# Patient Record
Sex: Female | Born: 1980 | Race: Black or African American | Hispanic: No | Marital: Single | Smoking: Never smoker
Health system: Southern US, Community
[De-identification: ages and names within clinical notes are randomized; demographics above are authoritative.]

## PROBLEM LIST (undated history)

## (undated) ENCOUNTER — Inpatient Hospital Stay (HOSPITAL_COMMUNITY): Payer: Self-pay

## (undated) DIAGNOSIS — G43909 Migraine, unspecified, not intractable, without status migrainosus: Secondary | ICD-10-CM

## (undated) DIAGNOSIS — L309 Dermatitis, unspecified: Secondary | ICD-10-CM

## (undated) DIAGNOSIS — Z9889 Other specified postprocedural states: Secondary | ICD-10-CM

## (undated) DIAGNOSIS — Z8742 Personal history of other diseases of the female genital tract: Secondary | ICD-10-CM

## (undated) DIAGNOSIS — N870 Mild cervical dysplasia: Secondary | ICD-10-CM

## (undated) DIAGNOSIS — K08409 Partial loss of teeth, unspecified cause, unspecified class: Secondary | ICD-10-CM

## (undated) DIAGNOSIS — R87629 Unspecified abnormal cytological findings in specimens from vagina: Secondary | ICD-10-CM

## (undated) HISTORY — DX: Mild cervical dysplasia: N87.0

## (undated) HISTORY — DX: Migraine, unspecified, not intractable, without status migrainosus: G43.909

## (undated) HISTORY — PX: COLPOSCOPY: SHX161

## (undated) HISTORY — PX: WISDOM TOOTH EXTRACTION: SHX21

---

## 2000-01-18 ENCOUNTER — Other Ambulatory Visit: Admission: RE | Admit: 2000-01-18 | Discharge: 2000-01-18 | Payer: Self-pay | Admitting: *Deleted

## 2001-04-01 ENCOUNTER — Other Ambulatory Visit: Admission: RE | Admit: 2001-04-01 | Discharge: 2001-04-01 | Payer: Self-pay | Admitting: *Deleted

## 2002-03-13 ENCOUNTER — Other Ambulatory Visit: Admission: RE | Admit: 2002-03-13 | Discharge: 2002-03-13 | Payer: Self-pay | Admitting: *Deleted

## 2006-01-24 ENCOUNTER — Ambulatory Visit: Payer: Self-pay | Admitting: Gastroenterology

## 2006-02-08 ENCOUNTER — Ambulatory Visit: Payer: Self-pay | Admitting: Gastroenterology

## 2006-04-05 ENCOUNTER — Ambulatory Visit: Payer: Self-pay | Admitting: Gastroenterology

## 2006-08-16 ENCOUNTER — Other Ambulatory Visit: Admission: RE | Admit: 2006-08-16 | Discharge: 2006-08-16 | Payer: Self-pay | Admitting: Obstetrics and Gynecology

## 2007-02-20 ENCOUNTER — Other Ambulatory Visit: Admission: RE | Admit: 2007-02-20 | Discharge: 2007-02-20 | Payer: Self-pay | Admitting: Obstetrics and Gynecology

## 2007-06-06 ENCOUNTER — Emergency Department (HOSPITAL_COMMUNITY): Admission: EM | Admit: 2007-06-06 | Discharge: 2007-06-06 | Payer: Self-pay | Admitting: Emergency Medicine

## 2008-08-12 ENCOUNTER — Ambulatory Visit: Payer: Self-pay | Admitting: Obstetrics and Gynecology

## 2008-08-12 ENCOUNTER — Other Ambulatory Visit: Admission: RE | Admit: 2008-08-12 | Discharge: 2008-08-12 | Payer: Self-pay | Admitting: Obstetrics and Gynecology

## 2008-08-12 ENCOUNTER — Encounter: Payer: Self-pay | Admitting: Obstetrics and Gynecology

## 2008-09-18 ENCOUNTER — Ambulatory Visit: Payer: Self-pay | Admitting: Obstetrics and Gynecology

## 2008-09-22 ENCOUNTER — Ambulatory Visit: Payer: Self-pay | Admitting: Obstetrics and Gynecology

## 2008-09-28 ENCOUNTER — Encounter: Payer: Self-pay | Admitting: Obstetrics and Gynecology

## 2008-09-28 ENCOUNTER — Ambulatory Visit: Payer: Self-pay | Admitting: Obstetrics and Gynecology

## 2008-09-28 ENCOUNTER — Ambulatory Visit (HOSPITAL_BASED_OUTPATIENT_CLINIC_OR_DEPARTMENT_OTHER): Admission: RE | Admit: 2008-09-28 | Discharge: 2008-09-28 | Payer: Self-pay | Admitting: Obstetrics and Gynecology

## 2008-09-28 DIAGNOSIS — Z9889 Other specified postprocedural states: Secondary | ICD-10-CM

## 2008-09-28 HISTORY — DX: Other specified postprocedural states: Z98.890

## 2008-09-28 HISTORY — PX: PELVIC LAPAROSCOPY: SHX162

## 2008-10-12 ENCOUNTER — Ambulatory Visit: Payer: Self-pay | Admitting: Obstetrics and Gynecology

## 2008-11-13 ENCOUNTER — Emergency Department (HOSPITAL_COMMUNITY): Admission: EM | Admit: 2008-11-13 | Discharge: 2008-11-13 | Payer: Self-pay | Admitting: Emergency Medicine

## 2009-02-27 ENCOUNTER — Emergency Department (HOSPITAL_COMMUNITY): Admission: EM | Admit: 2009-02-27 | Discharge: 2009-02-27 | Payer: Self-pay | Admitting: Emergency Medicine

## 2009-03-24 ENCOUNTER — Encounter: Payer: Self-pay | Admitting: Obstetrics and Gynecology

## 2009-03-24 ENCOUNTER — Ambulatory Visit: Payer: Self-pay | Admitting: Obstetrics and Gynecology

## 2009-03-24 ENCOUNTER — Other Ambulatory Visit: Admission: RE | Admit: 2009-03-24 | Discharge: 2009-03-24 | Payer: Self-pay | Admitting: Obstetrics and Gynecology

## 2009-05-21 ENCOUNTER — Emergency Department (HOSPITAL_COMMUNITY): Admission: EM | Admit: 2009-05-21 | Discharge: 2009-05-21 | Payer: Self-pay | Admitting: Emergency Medicine

## 2009-06-10 ENCOUNTER — Ambulatory Visit: Payer: Self-pay | Admitting: Gynecology

## 2009-06-15 ENCOUNTER — Ambulatory Visit: Payer: Self-pay | Admitting: Obstetrics and Gynecology

## 2009-08-17 ENCOUNTER — Other Ambulatory Visit: Admission: RE | Admit: 2009-08-17 | Discharge: 2009-08-17 | Payer: Self-pay | Admitting: Obstetrics and Gynecology

## 2009-08-17 ENCOUNTER — Ambulatory Visit: Payer: Self-pay | Admitting: Obstetrics and Gynecology

## 2009-11-22 ENCOUNTER — Emergency Department (HOSPITAL_COMMUNITY): Admission: EM | Admit: 2009-11-22 | Discharge: 2009-11-22 | Payer: Self-pay | Admitting: Family Medicine

## 2009-12-08 ENCOUNTER — Ambulatory Visit: Payer: Self-pay | Admitting: Obstetrics and Gynecology

## 2010-06-14 ENCOUNTER — Emergency Department (HOSPITAL_COMMUNITY): Admission: EM | Admit: 2010-06-14 | Discharge: 2010-06-14 | Payer: Self-pay | Admitting: Family Medicine

## 2010-06-15 ENCOUNTER — Other Ambulatory Visit: Admission: RE | Admit: 2010-06-15 | Discharge: 2010-06-15 | Payer: Self-pay | Admitting: Obstetrics and Gynecology

## 2010-06-15 ENCOUNTER — Ambulatory Visit: Payer: Self-pay | Admitting: Obstetrics and Gynecology

## 2010-12-05 LAB — POCT I-STAT, CHEM 8
BUN: 10 mg/dL (ref 6–23)
Creatinine, Ser: 0.6 mg/dL (ref 0.4–1.2)
Glucose, Bld: 95 mg/dL (ref 70–99)
Sodium: 138 mEq/L (ref 135–145)
TCO2: 27 mmol/L (ref 0–100)

## 2011-01-24 NOTE — Op Note (Signed)
Christie Camacho, Christie Camacho               ACCOUNT NO.:  [id number]   MEDICAL RECORD NO.:  [id number]          PATIENT TYPE:  AMB   LOCATION:  [facility]                         FACILITY:  [facility]   PHYSICIAN:  Daniel L. Gottsegen, M.D.DATE OF BIRTH:  May 03, 1981   DATE OF PROCEDURE:  09/28/2008  DATE OF DISCHARGE:                               OPERATIVE REPORT   PREOPERATIVE DIAGNOSES:  Pelvic pain, possible endometriosis.   POSTOPERATIVE DIAGNOSES:  Pelvic pain with endometriosis.   NAME OF OPERATION:  Diagnostic laparoscopy with lysis of pelvic  adhesions, excision of endometriosis.   SURGEON:  Daniel L. Eda Paschal, M.D.   ANESTHESIA:  General.   INDICATIONS:  The patient is a 30 year old nulligravida who has suffered  with intermittent lower vaginal pelvic pain for approximately 2-3 years.  The pain is intermittent, it can be severe or it can be completely gone.  She describes it as being near the top of her vagina rather than in her  lower abdomen.  It is not related to her periods.  She had a GI  assessment by Dr. Melvia Heaps that only revealed GERD.  She had a  urological assessment by Larey Dresser for microscopic hematuria  including a cystoscopy, and this did not show any reason for the pain.  She has been on oral contraceptives without much relief from the above.  She now enters the hospital for laparoscopy for both diagnosis and  treatment of the above.   FINDINGS:  At the time of laparoscopy, the patient had a mobile normal  uterus without disease.  Vesicouterine fold of peritoneum was free of  disease.  Left ovary and tube were completely normal, free of disease.  Underneath the left ovary there was no evidence of disease.  The patient  had luxuriant fimbria on the left fallopian tube.  On the right side,  however, the patient's right ovary was adherent to the peritoneum  posteriorly.  The fallopian tube itself was normal with luxuriant  fimbria.  Once the ovary was freed up  there were endometrial implants  where the ovary was attached as well as on the pelvic peritoneum in that  area.  The cul-de-sac otherwise was free of disease.   PROCEDURE:  After adequate general endotracheal anesthesia, the patient  was placed in the dorsal lithotomy position, prepped and draped in the  usual sterile manner.  A Hulka catheter was inserted into the patient's  uterus and a Robinson catheter emptied her bladder.  A subumbilical  incision was made using the Optiview and the diagnostic laparoscope.  The peritoneal cavity was entered atraumatically.  A pneumoperitoneum  was created.  A 5-mm port was placed suprapubically through which a  variety of instruments was placed.  The pelvis was assessed carefully  and was as noted above.  The neodymium YAG laser was then utilized using  the operating laparoscope.  It was set at 8 watts power, continuous.  A  G4 tip was used and the ovary was freed up carefully from the  peritoneum.  The areas of endometriosis in the ovary were laser  vaporized.  The areas  of endometriosis on the peritoneum where the ovary  was attached were laser excised and sent to pathology for tissue  diagnosis.  Copious irrigation was done with Ringer's lactate.  At the  termination of the procedure there was absolutely no bleeding noted and  no further evidence of endometriosis.  The trocars were removed.  The  pneumoperitoneum was evacuated.  The subumbilical fascial incision was  closed with a 0 Vicryl.  The skin incision at the umbilicus was closed  with Dermabond.  Because of some light bleeding, the 5-mm port incision suprapubically  was closed with 2 interrupted 3-0 Monocryl.  Estimated blood loss for  the entire procedure was less than 50 mL with none replaced.  The  patient tolerated the procedure well and left the operating room in  satisfactory condition.      Daniel L. Eda Paschal, M.D.  Electronically Signed     DLG/MEDQ  D:  09/28/2008  T:   09/28/2008  Job:  [id number]

## 2011-01-27 NOTE — Assessment & Plan Note (Signed)
[facility]                           GASTROENTEROLOGY OFFICE NOTE   NAME:HODGESJaonna, Christie Camacho                        MRN:          [id number]  DATE:04/05/2006                            DOB:          [birth year]    PROBLEM:  Nausea and vomiting.   REASON FOR VISIT:  [name] has returned for scheduled GI followup.  Upper  endoscopy was unremarkable.  She was subsequently seen by OB/GYN where she  apparently had cervical inflammation and was treated for an STD.  Since that  time, her GI complaints have entirely subsided.  She remains on an oral  contraceptive only.   PHYSICAL EXAMINATION:  VITAL SIGNS:  Pulse 74, blood pressure 112/80, weight  146.   IMPRESSION:  Nausea and vomiting, resolved.  It appears that this was  probably related to a subclinical gynecologic infection.   RECOMMENDATIONS:  Return to GI as needed.                                   Barbette Hair. Arlyce Dice, MD, [facility]   RDK/MedQ  DD:  04/05/2006  DT:  04/05/2006  Job #:  [id number]   cc:   Lelon Perla, DO

## 2011-01-29 ENCOUNTER — Inpatient Hospital Stay (INDEPENDENT_AMBULATORY_CARE_PROVIDER_SITE_OTHER)
Admission: RE | Admit: 2011-01-29 | Discharge: 2011-01-29 | Disposition: A | Discharge status: Home / Self Care | Payer: BC Managed Care – PPO | Source: Ambulatory Visit | Attending: Family Medicine | Admitting: Family Medicine

## 2011-01-29 DIAGNOSIS — L03019 Cellulitis of unspecified finger: Secondary | ICD-10-CM

## 2011-02-13 ENCOUNTER — Encounter: Payer: Self-pay | Admitting: Obstetrics and Gynecology

## 2011-02-27 ENCOUNTER — Encounter (INDEPENDENT_AMBULATORY_CARE_PROVIDER_SITE_OTHER): Payer: BC Managed Care – PPO | Admitting: Obstetrics and Gynecology

## 2011-02-27 ENCOUNTER — Other Ambulatory Visit: Payer: Self-pay | Admitting: Obstetrics and Gynecology

## 2011-02-27 DIAGNOSIS — R87619 Unspecified abnormal cytological findings in specimens from cervix uteri: Secondary | ICD-10-CM | POA: Insufficient documentation

## 2011-02-27 DIAGNOSIS — R823 Hemoglobinuria: Secondary | ICD-10-CM

## 2011-02-27 DIAGNOSIS — Z01419 Encounter for gynecological examination (general) (routine) without abnormal findings: Secondary | ICD-10-CM

## 2011-03-06 ENCOUNTER — Other Ambulatory Visit: Payer: BC Managed Care – PPO

## 2011-03-06 ENCOUNTER — Ambulatory Visit: Payer: BC Managed Care – PPO | Admitting: Obstetrics and Gynecology

## 2011-03-17 ENCOUNTER — Other Ambulatory Visit: Payer: BC Managed Care – PPO

## 2011-03-17 ENCOUNTER — Ambulatory Visit (INDEPENDENT_AMBULATORY_CARE_PROVIDER_SITE_OTHER): Payer: BC Managed Care – PPO | Admitting: Obstetrics and Gynecology

## 2011-03-17 DIAGNOSIS — D259 Leiomyoma of uterus, unspecified: Secondary | ICD-10-CM

## 2011-03-17 DIAGNOSIS — N949 Unspecified condition associated with female genital organs and menstrual cycle: Secondary | ICD-10-CM

## 2011-03-17 DIAGNOSIS — N809 Endometriosis, unspecified: Secondary | ICD-10-CM

## 2011-03-30 ENCOUNTER — Other Ambulatory Visit (HOSPITAL_COMMUNITY)
Admission: RE | Admit: 2011-03-30 | Discharge: 2011-03-30 | Disposition: A | Discharge status: Home / Self Care | Payer: BC Managed Care – PPO | Source: Ambulatory Visit | Attending: Obstetrics and Gynecology | Admitting: Obstetrics and Gynecology

## 2011-05-02 ENCOUNTER — Inpatient Hospital Stay (INDEPENDENT_AMBULATORY_CARE_PROVIDER_SITE_OTHER)
Admission: RE | Admit: 2011-05-02 | Discharge: 2011-05-02 | Disposition: A | Discharge status: Home / Self Care | Payer: BC Managed Care – PPO | Source: Ambulatory Visit | Attending: Family Medicine | Admitting: Family Medicine

## 2011-05-02 DIAGNOSIS — R071 Chest pain on breathing: Secondary | ICD-10-CM

## 2011-09-28 ENCOUNTER — Ambulatory Visit (INDEPENDENT_AMBULATORY_CARE_PROVIDER_SITE_OTHER): Payer: BC Managed Care – PPO | Admitting: Obstetrics and Gynecology

## 2011-09-28 DIAGNOSIS — N898 Other specified noninflammatory disorders of vagina: Secondary | ICD-10-CM

## 2011-09-28 DIAGNOSIS — N760 Acute vaginitis: Secondary | ICD-10-CM

## 2011-09-28 DIAGNOSIS — N899 Noninflammatory disorder of vagina, unspecified: Secondary | ICD-10-CM

## 2011-09-28 DIAGNOSIS — A499 Bacterial infection, unspecified: Secondary | ICD-10-CM

## 2011-09-28 LAB — WET PREP FOR TRICH, YEAST, CLUE: Trich, Wet Prep: NONE SEEN

## 2011-09-28 MED ORDER — METRONIDAZOLE 0.75 % VA GEL: 1.0000 | Freq: Two times a day (BID) | VAGINAL | Status: AC

## 2011-09-28 NOTE — Progress Notes (Signed)
Patient came to see me today because of irritation on the opening of her vagina. She looked with the mirror and sore some small lesions. She is unaware of vaginal discharge. She is unaware odor  or itching. She has not been on antibiotics.  External exam: She is very irritated on the right near the introitus and it looks as if she's scratched way the skin. There were also some very small areas of folliculitis. There is no evidence of herpetic lesion or any other lesion. Vaginal exam: Very heavy discharge. Wet prep positive for clue cells white blood cells and bacteria.  Assessment: Bacterial vaginosis  Plan: MetroGel vaginal cream one applicatorful at bedtime in the vagina for 5 nights.

## 2011-10-24 ENCOUNTER — Emergency Department (HOSPITAL_COMMUNITY)
Admission: EM | Admit: 2011-10-24 | Discharge: 2011-10-25 | Disposition: A | Discharge status: Home / Self Care | Payer: BC Managed Care – PPO | Attending: Emergency Medicine | Admitting: Emergency Medicine

## 2011-10-24 ENCOUNTER — Encounter (HOSPITAL_COMMUNITY): Payer: Self-pay | Admitting: Emergency Medicine

## 2011-10-24 ENCOUNTER — Encounter (HOSPITAL_COMMUNITY): Payer: Self-pay | Admitting: *Deleted

## 2011-10-24 ENCOUNTER — Emergency Department (INDEPENDENT_AMBULATORY_CARE_PROVIDER_SITE_OTHER)
Admission: EM | Admit: 2011-10-24 | Discharge: 2011-10-24 | Disposition: A | Discharge status: Other Acute Inpatient Hospital | Payer: BC Managed Care – PPO | Source: Home / Self Care | Attending: Emergency Medicine | Admitting: Emergency Medicine

## 2011-10-24 DIAGNOSIS — R112 Nausea with vomiting, unspecified: Secondary | ICD-10-CM | POA: Insufficient documentation

## 2011-10-24 DIAGNOSIS — R109 Unspecified abdominal pain: Secondary | ICD-10-CM | POA: Insufficient documentation

## 2011-10-24 DIAGNOSIS — R319 Hematuria, unspecified: Secondary | ICD-10-CM

## 2011-10-24 DIAGNOSIS — R1031 Right lower quadrant pain: Secondary | ICD-10-CM

## 2011-10-24 LAB — POCT URINALYSIS DIP (DEVICE)
Bilirubin Urine: NEGATIVE
Glucose, UA: NEGATIVE mg/dL
Leukocytes, UA: NEGATIVE
Nitrite: NEGATIVE

## 2011-10-24 LAB — POCT PREGNANCY, URINE: Preg Test, Ur: NEGATIVE

## 2011-10-24 NOTE — ED Provider Notes (Signed)
History     CSN: [id number]  Arrival date & time 10/24/11  1819   First MD Initiated Contact with Patient 10/24/11 2028      Chief Complaint  Patient presents with  . Abdominal Pain    (Consider location/radiation/quality/duration/timing/severity/associated sxs/prior treatment) HPI Comments: Patient with 5 days of constant, but waxing and waning, sharp abdominal pain that started around the umbilicus 5 days ago. Patient states it is now migrated to the right lower quadrant over the past 2 days. Reports vomiting starting today. No fevers, abdominal distention, back pain, urinary urgency, frequency, oderous urine, cloudy urine, vaginal bleeding, vaginal discharge. Pain not affected with eating, fasting, movement, urination. Last bowel movement was today, and was WNL for patient. Patient reports mild anorexia. Has a history of endometriosis, but states that this feels "different" than previous endometriosis pain. Hasn't taken anything for her symptoms. Past medical history significant for endometriosis. No history of PID, ovarian disorders, diabetes, nephrolithiasis.  ROS as noted in HPI. All other ROS negative.   Patient is a 31 y.o. female presenting with abdominal pain. The history is provided by the patient. No language interpreter was used.  Abdominal Pain The primary symptoms of the illness include abdominal pain. The current episode started more than 2 days ago. The problem has been gradually worsening.  The patient states that she believes she is currently not pregnant. The patient has not had a change in bowel habit. Additional symptoms associated with the illness include anorexia. Symptoms associated with the illness do not include constipation, urgency, hematuria, frequency or back pain.    Past Medical History  Diagnosis Date  . CIN I (cervical intraepithelial neoplasia I)   . Endometriosis   . Migraines     Past Surgical History  Procedure Date  . Pelvic laparoscopy  09/28/08    DIAGNOSTIC LAPAROSCOPY    Family History  Problem Relation Age of Onset  . Hypertension Mother   . Diabetes Father   . Hypertension Father     History  Substance Use Topics  . Smoking status: Never Smoker   . Smokeless tobacco: Not on file  . Alcohol Use: Yes    OB History    Grav Para Term Preterm Abortions TAB SAB Ect Mult Living                  Review of Systems  Gastrointestinal: Positive for abdominal pain and anorexia. Negative for constipation.  Genitourinary: Negative for urgency, frequency and hematuria.  Musculoskeletal: Negative for back pain.    Allergies  Review of patient's allergies indicates no known allergies.  Home Medications   Current Outpatient Rx  Name Route Sig Dispense Refill  . MULTIVITAMIN PO Oral Take by mouth.        BP 137/87  Pulse 88  Temp(Src) 98.4 F (36.9 C) (Oral)  Resp 16  SpO2 100%  LMP 10/10/2011  Physical Exam  Nursing note and vitals reviewed. Constitutional: She is oriented to person, place, and time. She appears well-developed and well-nourished.  HENT:  Head: Normocephalic and atraumatic.  Eyes: Conjunctivae and EOM are normal.  Neck: Normal range of motion.  Cardiovascular: Normal rate, regular rhythm, normal heart sounds and intact distal pulses.   No murmur heard. Pulmonary/Chest: Effort normal and breath sounds normal.  Abdominal: Soft. Bowel sounds are normal. She exhibits no distension. There is no hepatomegaly. There is tenderness in the right lower quadrant. There is tenderness at McBurney's point. There is no rebound, no guarding, no CVA  tenderness and negative Murphy's sign.       Healed laparoscopic scars  Musculoskeletal: Normal range of motion. She exhibits no edema and no tenderness.  Neurological: She is alert and oriented to person, place, and time.  Skin: Skin is warm and dry.  Psychiatric: She has a normal mood and affect. Her behavior is normal. Judgment and thought content normal.     ED Course  Procedures (including critical care time)  Labs Reviewed  POCT URINALYSIS DIP (DEVICE) - Abnormal; Notable for the following:    Ketones, ur 15 (*)    Hgb urine dipstick LARGE (*)    All other components within normal limits  POCT PREGNANCY, URINE   No results found.   No diagnosis found.  Results for orders placed during the hospital encounter of 10/24/11  POCT URINALYSIS DIP (DEVICE)      Component Value Range   Glucose, UA NEGATIVE  NEGATIVE (mg/dL)   Bilirubin Urine NEGATIVE  NEGATIVE    Ketones, ur 15 (*) NEGATIVE (mg/dL)   Specific Gravity, Urine >=1.030  1.005 - 1.030    Hgb urine dipstick LARGE (*) NEGATIVE    pH 5.5  5.0 - 8.0    Protein, ur NEGATIVE  NEGATIVE (mg/dL)   Urobilinogen, UA 0.2  0.0 - 1.0 (mg/dL)   Nitrite NEGATIVE  NEGATIVE    Leukocytes, UA NEGATIVE  NEGATIVE   POCT PREGNANCY, URINE      Component Value Range   Preg Test, Ur NEGATIVE  NEGATIVE     MDM  Patient with history and exam concerning for appendicitis. Udip noted. Patient denies any vaginal bleeding. Kidney stone also in differential. Transferring to the ER.  Luiz Blare, MD 10/24/11 2105

## 2011-10-24 NOTE — ED Notes (Signed)
C/o abdominal pain, more so on the right lower abdomen than the left.  No back pain.  Last bowel movement was today and normal per patient.  Patient denies urinary symptoms.  Denies vaginal discharge.  No fever, but has had nausea, and one episode of vomiting

## 2011-10-24 NOTE — ED Notes (Signed)
The pt was transferred down from [facility] with abd pain.  She has had abd pain for 4 days.  lmp jan 29th

## 2011-10-25 ENCOUNTER — Emergency Department (HOSPITAL_COMMUNITY): Payer: BC Managed Care – PPO

## 2011-10-25 ENCOUNTER — Encounter (HOSPITAL_COMMUNITY): Payer: Self-pay | Admitting: Radiology

## 2011-10-25 LAB — CBC
HCT: 43.1 % (ref 36.0–46.0)
MCHC: 32.9 g/dL (ref 30.0–36.0)
MCV: 81.6 fL (ref 78.0–100.0)
RDW: 13.5 % (ref 11.5–15.5)

## 2011-10-25 LAB — BASIC METABOLIC PANEL
BUN: 9 mg/dL (ref 6–23)
CO2: 27 mEq/L (ref 19–32)
Chloride: 103 mEq/L (ref 96–112)
Creatinine, Ser: 0.74 mg/dL (ref 0.50–1.10)

## 2011-10-25 LAB — URINALYSIS, ROUTINE W REFLEX MICROSCOPIC
Leukocytes, UA: NEGATIVE
Protein, ur: NEGATIVE mg/dL
Urobilinogen, UA: 0.2 mg/dL (ref 0.0–1.0)

## 2011-10-25 MED ORDER — HYDROMORPHONE HCL PF 1 MG/ML IJ SOLN
1.0000 mg | Freq: Once | INTRAMUSCULAR | Status: AC
  Administered 2011-10-25: 1 mg via INTRAVENOUS
  Filled 2011-10-25: qty 1

## 2011-10-25 MED ORDER — ONDANSETRON HCL 4 MG/2ML IJ SOLN
4.0000 mg | Freq: Once | INTRAMUSCULAR | Status: AC
  Administered 2011-10-25: 4 mg via INTRAVENOUS
  Filled 2011-10-25: qty 2

## 2011-10-25 MED ORDER — KETOROLAC TROMETHAMINE 30 MG/ML IJ SOLN
30.0000 mg | Freq: Once | INTRAMUSCULAR | Status: AC
  Administered 2011-10-25: 30 mg via INTRAVENOUS
  Filled 2011-10-25: qty 1

## 2011-10-25 MED ORDER — SODIUM CHLORIDE 0.9 % IV BOLUS (SEPSIS)
1000.0000 mL | Freq: Once | INTRAVENOUS | Status: AC
  Administered 2011-10-25: 1000 mL via INTRAVENOUS

## 2011-10-25 NOTE — ED Notes (Signed)
Pt states pain 2/10.  Resting comfortably.

## 2011-10-25 NOTE — Discharge Instructions (Signed)
Your evaluation in the emergency department tonight did not result in a specific diagnosis of your abdominal pain.  It is very important that you continue to have your condition evaluated by her primary care physician.  Please make sure to call today to schedule an appropriate followup visit.  Please return to the emergency department for any concerning changes in your condition.   Abdominal Pain Abdominal pain can be caused by many things. Your caregiver decides the seriousness of your pain by an examination and possibly blood tests and X-rays. Many cases can be observed and treated at home. Most abdominal pain is not caused by a disease and will probably improve without treatment. However, in many cases, more time must pass before a clear cause of the pain can be found. Before that point, it may not be known if you need more testing, or if hospitalization or surgery is needed. HOME CARE INSTRUCTIONS   Do not take laxatives unless directed by your caregiver.   Take pain medicine only as directed by your caregiver.   Only take over-the-counter or prescription medicines for pain, discomfort, or fever as directed by your caregiver.   Try a clear liquid diet (broth, tea, or water) for as long as directed by your caregiver. Slowly move to a bland diet as tolerated.  SEEK IMMEDIATE MEDICAL CARE IF:   The pain does not go away.   You have a fever.   You keep throwing up (vomiting).   The pain is felt only in portions of the abdomen. Pain in the right side could possibly be appendicitis. In an adult, pain in the left lower portion of the abdomen could be colitis or diverticulitis.   You pass bloody or black tarry stools.  MAKE SURE YOU:   Understand these instructions.   Will watch your condition.   Will get help right away if you are not doing well or get worse.  Document Released: 06/07/2005 Document Revised: 05/10/2011 Document Reviewed: 04/15/2008 [facility] Patient Information 2012  [place], [place].

## 2011-10-25 NOTE — ED Provider Notes (Signed)
History     CSN: [id number]  Arrival date & time 10/24/11  2304   None     Chief Complaint  Patient presents with  . Abdominal Pain    HPI  the patient presents with 5 days of abdominal pain.  She notes a waxing and waning.  The pain is focally about the right lower quadrant, though it initiated in her epigastriumn insidious onset.  Since onset there is been at least some degree of pain constantly, though the pain started near her umbilicus.  The pain is sharp. She notes multiple episodes of emesis, mild nausea.  No diarrhea.  No fevers, no chills, no dyspnea, no chest pain, no other focal complaints.  Patient initially presented to urgent care, was transferred to this facility for further evaluation. Last menstrual period 2 weeks ago, no vaginal discharge, no bleeding, no pain  Past Medical History  Diagnosis Date  . CIN I (cervical intraepithelial neoplasia I)   . Endometriosis   . Migraines     Past Surgical History  Procedure Date  . Pelvic laparoscopy 09/28/08    DIAGNOSTIC LAPAROSCOPY    Family History  Problem Relation Age of Onset  . Hypertension Mother   . Diabetes Father   . Hypertension Father     History  Substance Use Topics  . Smoking status: Never Smoker   . Smokeless tobacco: Not on file  . Alcohol Use: Yes    OB History    Grav Para Term Preterm Abortions TAB SAB Ect Mult Living                  Review of Systems  Constitutional:       HPI  HENT:       HPI otherwise negative  Eyes: Negative.   Respiratory:       HPI, otherwise negative  Cardiovascular:       HPI, otherwise nmegative  Gastrointestinal: Positive for vomiting.  Genitourinary:       HPI, otherwise negative  Musculoskeletal:       HPI, otherwise negative  Skin: Negative.   Neurological: Negative for syncope.    Allergies  Review of patient's allergies indicates no known allergies.  Home Medications   Current Outpatient Rx  Name Route Sig Dispense Refill  . THERA  M PLUS PO TABS Oral Take 1 tablet by mouth daily.      BP 121/82  Pulse 71  Temp(Src) 98.3 F (36.8 C) (Oral)  Resp 20  SpO2 100%  LMP 10/10/2011  Physical Exam  Nursing note and vitals reviewed. Constitutional: She is oriented to person, place, and time. She appears well-developed and well-nourished. No distress.  HENT:  Head: Normocephalic and atraumatic.  Eyes: Conjunctivae and EOM are normal.  Cardiovascular: Normal rate and regular rhythm.   Pulmonary/Chest: Effort normal and breath sounds normal. No stridor. No respiratory distress.  Abdominal: Soft. Bowel sounds are normal. She exhibits no distension. There is no tenderness. There is no rebound and no guarding.  Musculoskeletal: She exhibits no edema.  Neurological: She is alert and oriented to person, place, and time. No cranial nerve deficit.  Skin: Skin is warm and dry.  Psychiatric: She has a normal mood and affect.    ED Course  Procedures (including critical care time)  Labs Reviewed  URINALYSIS, ROUTINE W REFLEX MICROSCOPIC - Abnormal; Notable for the following:    Hgb urine dipstick MODERATE (*)    All other components within normal limits  URINE MICROSCOPIC-ADD ON -  Abnormal; Notable for the following:    Squamous Epithelial / LPF FEW (*)    All other components within normal limits  POCT PREGNANCY, URINE  BASIC METABOLIC PANEL  CBC   No results found.   No diagnosis found.    MDM  This generally well female presents with several days of abdominal pain.  On exam she is in no distress.  The patient's history of right lower quadrant pain, absent any vaginal complaints is concerning for acute appendicitis.  The patient's CT does not demonstrate acute pathology.  The remainder of her evaluation is essentially reassuring.  Given his absence of acute findings, the patient's generally benign status, she'll be discharged in stable condition to follow up with her primary care physician        Gerhard Munch, MD 10/25/11 0500

## 2011-10-25 NOTE — ED Notes (Signed)
Pt states pain continues to be 8/10.  Pt calm.  VS stable.  Pt told of plan of care and IV placed.

## 2011-11-30 ENCOUNTER — Other Ambulatory Visit (HOSPITAL_COMMUNITY): Payer: Self-pay | Admitting: Urology

## 2011-11-30 DIAGNOSIS — K819 Cholecystitis, unspecified: Secondary | ICD-10-CM

## 2011-12-07 ENCOUNTER — Ambulatory Visit (HOSPITAL_COMMUNITY)
Admission: RE | Admit: 2011-12-07 | Discharge: 2011-12-07 | Disposition: A | Discharge status: Home / Self Care | Payer: BC Managed Care – PPO | Source: Ambulatory Visit | Attending: Urology | Admitting: Urology

## 2011-12-07 DIAGNOSIS — R109 Unspecified abdominal pain: Secondary | ICD-10-CM | POA: Insufficient documentation

## 2011-12-07 DIAGNOSIS — R112 Nausea with vomiting, unspecified: Secondary | ICD-10-CM | POA: Insufficient documentation

## 2011-12-07 DIAGNOSIS — K819 Cholecystitis, unspecified: Secondary | ICD-10-CM

## 2012-02-01 ENCOUNTER — Encounter (HOSPITAL_COMMUNITY): Payer: Self-pay | Admitting: *Deleted

## 2012-02-01 ENCOUNTER — Emergency Department (INDEPENDENT_AMBULATORY_CARE_PROVIDER_SITE_OTHER)
Admission: EM | Admit: 2012-02-01 | Discharge: 2012-02-01 | Disposition: A | Discharge status: Home / Self Care | Payer: BC Managed Care – PPO | Source: Home / Self Care | Attending: Family Medicine | Admitting: Family Medicine

## 2012-02-01 DIAGNOSIS — L259 Unspecified contact dermatitis, unspecified cause: Secondary | ICD-10-CM

## 2012-02-01 HISTORY — DX: Dermatitis, unspecified: L30.9

## 2012-02-01 MED ORDER — FLUTICASONE PROPIONATE 0.05 % EX CREA: TOPICAL_CREAM | Freq: Two times a day (BID) | CUTANEOUS | Status: AC

## 2012-02-01 NOTE — ED Notes (Signed)
Pt  Reports  Symptoms  Of  Rash  Between  Thighs   X    2  Days   She  Reports  She  Has   Eczyma    In past    And  Has  Seen a  Dermatologist   In past

## 2012-02-01 NOTE — ED Provider Notes (Signed)
History     CSN: [id number]  Arrival date & time 02/01/12  1352   First MD Initiated Contact with Patient 02/01/12 1403      Chief Complaint  Patient presents with  . Rash    (Consider location/radiation/quality/duration/timing/severity/associated sxs/prior treatment) Patient is a 31 y.o. female presenting with rash. The history is provided by the patient.  Rash  This is a new problem. The current episode started 2 days ago. The problem has not changed since onset.The problem is associated with an unknown factor. The rash is present on the right upper leg and left upper leg. The pain is mild. Associated symptoms include itching.    Past Medical History  Diagnosis Date  . CIN I (cervical intraepithelial neoplasia I)   . Endometriosis   . Migraines   . Eczema     Past Surgical History  Procedure Date  . Pelvic laparoscopy 09/28/08    DIAGNOSTIC LAPAROSCOPY    Family History  Problem Relation Age of Onset  . Hypertension Mother   . Diabetes Father   . Hypertension Father     History  Substance Use Topics  . Smoking status: Never Smoker   . Smokeless tobacco: Not on file  . Alcohol Use: Yes    OB History    Grav Para Term Preterm Abortions TAB SAB Ect Mult Living                  Review of Systems  Constitutional: Negative.   Skin: Positive for itching and rash.    Allergies  Review of patient's allergies indicates no known allergies.  Home Medications   Current Outpatient Rx  Name Route Sig Dispense Refill  . FLUTICASONE PROPIONATE 0.05 % EX CREA Topical Apply topically 2 (two) times daily. 30 g 0  . THERA M PLUS PO TABS Oral Take 1 tablet by mouth daily.      BP 131/78  Pulse 81  Temp(Src) 98.6 F (37 C) (Oral)  Resp 16  SpO2 100%  LMP 01/03/2012  Physical Exam  Nursing note and vitals reviewed. Constitutional: She is oriented to person, place, and time. She appears well-developed and well-nourished.  Neurological: She is alert and  oriented to person, place, and time.  Skin: Skin is warm and dry. Rash noted.       Fine papular rash on prox inner thighs left > right.    ED Course  Procedures (including critical care time)  Labs Reviewed - No data to display No results found.   1. Contact dermatitis and eczema       MDM          Linna Hoff, MD 02/01/12 1432

## 2012-03-06 ENCOUNTER — Emergency Department (INDEPENDENT_AMBULATORY_CARE_PROVIDER_SITE_OTHER): Payer: BC Managed Care – PPO

## 2012-03-06 ENCOUNTER — Emergency Department (HOSPITAL_COMMUNITY)
Admission: EM | Admit: 2012-03-06 | Discharge: 2012-03-06 | Disposition: A | Discharge status: Home / Self Care | Payer: BC Managed Care – PPO | Source: Home / Self Care | Attending: Emergency Medicine | Admitting: Emergency Medicine

## 2012-03-06 ENCOUNTER — Encounter (HOSPITAL_COMMUNITY): Payer: Self-pay | Admitting: Emergency Medicine

## 2012-03-06 DIAGNOSIS — S91309A Unspecified open wound, unspecified foot, initial encounter: Secondary | ICD-10-CM

## 2012-03-06 DIAGNOSIS — S99929A Unspecified injury of unspecified foot, initial encounter: Secondary | ICD-10-CM

## 2012-03-06 MED ORDER — DICLOFENAC SODIUM 1 % TD GEL: 1.0000 "application " | Freq: Four times a day (QID) | TRANSDERMAL | Status: DC

## 2012-03-06 NOTE — ED Provider Notes (Signed)
History     CSN: [id number]  Arrival date & time 03/06/12  1531   First MD Initiated Contact with Patient 03/06/12 1610      Chief Complaint  Patient presents with  . Foot Pain    (Consider location/radiation/quality/duration/timing/severity/associated sxs/prior treatment) Patient is a 31 y.o. female presenting with lower extremity pain. The history is provided by the patient.  Foot Pain This is a new problem. The current episode started yesterday. The problem occurs constantly. The problem has not changed since onset.The symptoms are aggravated by walking.   The patient complains of pain and swelling on the base of her first toe on the right which started yesterday. She does not remember injuring it but was at the beach last week and did wear flip flops constants. She has tried icing it. No pain meds taken. No h/o Gout.    Past Medical History  Diagnosis Date  . CIN I (cervical intraepithelial neoplasia I)   . Endometriosis   . Migraines   . Eczema     Past Surgical History  Procedure Date  . Pelvic laparoscopy 09/28/08    DIAGNOSTIC LAPAROSCOPY    Family History  Problem Relation Age of Onset  . Hypertension Mother   . Diabetes Father   . Hypertension Father     History  Substance Use Topics  . Smoking status: Never Smoker   . Smokeless tobacco: Not on file  . Alcohol Use: Yes    OB History    Grav Para Term Preterm Abortions TAB SAB Ect Mult Living                  Review of Systems  Constitutional: Negative.   Respiratory: Negative.   Cardiovascular: Negative.   Gastrointestinal: Negative.   Genitourinary: Negative.   Musculoskeletal: Positive for joint swelling and arthralgias. Negative for back pain and gait problem.  Neurological: Negative.     Allergies  Review of patient's allergies indicates no known allergies.  Home Medications   Current Outpatient Rx  Name Route Sig Dispense Refill  . DICLOFENAC SODIUM 1 % TD GEL Topical Apply 1  application topically 4 (four) times daily. 100 g 0  . FLUTICASONE PROPIONATE 0.05 % EX CREA Topical Apply topically 2 (two) times daily. 30 g 0  . THERA M PLUS PO TABS Oral Take 1 tablet by mouth daily.      BP 130/84  Pulse 76  Temp 99.1 F (37.3 C) (Oral)  Resp 18  SpO2 100%  LMP 03/04/2012  Physical Exam  Constitutional: She is oriented to person, place, and time. She appears well-developed and well-nourished.  HENT:  Head: Normocephalic and atraumatic.  Eyes: Pupils are equal, round, and reactive to light.  Cardiovascular: Normal rate, regular rhythm and normal heart sounds.   Pulmonary/Chest: Breath sounds normal.  Abdominal: Soft.  Musculoskeletal:       Swelling and tenderness on medial aspect of head of first metatarsal. No warmth or discharge. Non-fluctuant.  Appears very similar to a bunion.   Neurological: She is alert and oriented to person, place, and time.  Skin: Skin is warm and dry. No erythema.    ED Course  Procedures (including critical care time)  Labs Reviewed - No data to display Dg Foot Complete Right  03/06/2012  *RADIOLOGY REPORT*  Clinical Data: First metatarsal and great toe pain for 1 day.  RIGHT FOOT COMPLETE - 3+ VIEW  Comparison: None.  Findings: Minimal soft tissue prominence medial to the head of  the first metatarsal noted.  No fracture is observed.  Bifid medial sesamoid of the first digit is incidentally observed.  No malalignment at the Lisfranc joint.  No acute bony findings.  IMPRESSION:  1.  Slight soft tissue prominence medial to the head of the first metatarsal, without significant underlying bony abnormality.  Original Report Authenticated By: Dellia Cloud, M.D.     1. Soft tissue injury of foot       MDM  Ice and then heat- post op shoe, Voltaren gel        Calvert Cantor, MD 03/06/12 Ernestina [place]

## 2012-03-06 NOTE — ED Notes (Signed)
Right foot pain.  Unknown injury.  Pain in joint associated with the base of great toe.  Reports swollen and painful

## 2012-03-06 NOTE — ED Notes (Signed)
Reviewed instructions and script for voltaren gel. Patient questioned a "boot". Asked physician

## 2012-06-23 ENCOUNTER — Emergency Department (HOSPITAL_COMMUNITY)
Admission: EM | Admit: 2012-06-23 | Discharge: 2012-06-23 | Disposition: A | Discharge status: Home / Self Care | Payer: BC Managed Care – PPO | Source: Home / Self Care | Attending: Family Medicine | Admitting: Family Medicine

## 2012-06-23 ENCOUNTER — Encounter (HOSPITAL_COMMUNITY): Payer: Self-pay | Admitting: *Deleted

## 2012-06-23 DIAGNOSIS — J069 Acute upper respiratory infection, unspecified: Secondary | ICD-10-CM

## 2012-06-23 MED ORDER — AZITHROMYCIN 250 MG PO TABS: ORAL_TABLET | ORAL | Status: DC

## 2012-06-23 NOTE — ED Notes (Signed)
Pt reports cough , lost of voice since Wednesday with no relief from otc products

## 2012-06-23 NOTE — ED Provider Notes (Signed)
History     CSN: [id number]  Arrival date & time 06/23/12  1325   First MD Initiated Contact with Patient 06/23/12 1329      Chief Complaint  Patient presents with  . URI    (Consider location/radiation/quality/duration/timing/severity/associated sxs/prior treatment) Patient is a 31 y.o. female presenting with URI. The history is provided by the patient.  URI The primary symptoms include sore throat and cough. Primary symptoms do not include fever, swollen glands, nausea, vomiting or myalgias. Primary symptoms comment: laryngitis  The current episode started 3 to 5 days ago. This is a new problem. The problem has not changed since onset. The illness is not associated with congestion or rhinorrhea.    Past Medical History  Diagnosis Date  . CIN I (cervical intraepithelial neoplasia I)   . Endometriosis   . Migraines   . Eczema     Past Surgical History  Procedure Date  . Pelvic laparoscopy 09/28/08    DIAGNOSTIC LAPAROSCOPY    Family History  Problem Relation Age of Onset  . Hypertension Mother   . Diabetes Father   . Hypertension Father     History  Substance Use Topics  . Smoking status: Never Smoker   . Smokeless tobacco: Not on file  . Alcohol Use: Yes    OB History    Grav Para Term Preterm Abortions TAB SAB Ect Mult Living                  Review of Systems  Constitutional: Negative.  Negative for fever.  HENT: Positive for sore throat and voice change. Negative for congestion, rhinorrhea and postnasal drip.   Respiratory: Positive for cough.   Cardiovascular: Negative.   Gastrointestinal: Negative.  Negative for nausea and vomiting.  Musculoskeletal: Negative for myalgias.    Allergies  Review of patient's allergies indicates no known allergies.  Home Medications   Current Outpatient Rx  Name Route Sig Dispense Refill  . AZITHROMYCIN 250 MG PO TABS  Take as directed on pack 6 each 0  . DICLOFENAC SODIUM 1 % TD GEL Topical Apply 1  application topically 4 (four) times daily. 100 g 0  . FLUTICASONE PROPIONATE 0.05 % EX CREA Topical Apply topically 2 (two) times daily. 30 g 0  . THERA M PLUS PO TABS Oral Take 1 tablet by mouth daily.      BP 146/89  Pulse 89  Temp 98.3 F (36.8 C) (Oral)  Resp 20  SpO2 100%  Physical Exam  Nursing note and vitals reviewed. Constitutional: She is oriented to person, place, and time. She appears well-developed and well-nourished.  HENT:  Head: Normocephalic.  Right Ear: External ear normal.  Left Ear: External ear normal.  Nose: Nose normal.  Mouth/Throat: Oropharynx is clear and moist.  Eyes: Pupils are equal, round, and reactive to light.  Neck: Normal range of motion. Neck supple.  Cardiovascular: Normal rate, regular rhythm, normal heart sounds and intact distal pulses.   Pulmonary/Chest: Effort normal and breath sounds normal.  Lymphadenopathy:    She has no cervical adenopathy.  Neurological: She is alert and oriented to person, place, and time.  Skin: Skin is warm and dry.    ED Course  Procedures (including critical care time)  Labs Reviewed - No data to display No results found.   1. URI (upper respiratory infection)       MDM          Linna Hoff, MD 06/23/12 1424

## 2012-07-25 ENCOUNTER — Other Ambulatory Visit: Payer: Self-pay | Admitting: Internal Medicine

## 2012-07-25 DIAGNOSIS — Z1231 Encounter for screening mammogram for malignant neoplasm of breast: Secondary | ICD-10-CM

## 2012-08-13 ENCOUNTER — Ambulatory Visit
Admission: RE | Admit: 2012-08-13 | Discharge: 2012-08-13 | Disposition: A | Discharge status: Home / Self Care | Payer: BC Managed Care – PPO | Source: Ambulatory Visit | Attending: Internal Medicine | Admitting: Internal Medicine

## 2012-08-13 ENCOUNTER — Ambulatory Visit: Payer: BC Managed Care – PPO

## 2012-08-13 DIAGNOSIS — Z1231 Encounter for screening mammogram for malignant neoplasm of breast: Secondary | ICD-10-CM

## 2012-09-05 ENCOUNTER — Encounter: Payer: Self-pay | Admitting: Obstetrics and Gynecology

## 2012-09-05 ENCOUNTER — Ambulatory Visit (INDEPENDENT_AMBULATORY_CARE_PROVIDER_SITE_OTHER): Payer: BC Managed Care – PPO | Admitting: Obstetrics and Gynecology

## 2012-09-05 ENCOUNTER — Other Ambulatory Visit (HOSPITAL_COMMUNITY)
Admission: RE | Admit: 2012-09-05 | Discharge: 2012-09-05 | Disposition: A | Discharge status: Home / Self Care | Payer: BC Managed Care – PPO | Source: Ambulatory Visit | Attending: Obstetrics and Gynecology | Admitting: Obstetrics and Gynecology

## 2012-09-05 VITALS — BP 124/80 | Ht 62.0 in | Wt 148.0 lb

## 2012-09-05 DIAGNOSIS — Z01419 Encounter for gynecological examination (general) (routine) without abnormal findings: Secondary | ICD-10-CM

## 2012-09-05 DIAGNOSIS — L309 Dermatitis, unspecified: Secondary | ICD-10-CM | POA: Insufficient documentation

## 2012-09-05 DIAGNOSIS — G43909 Migraine, unspecified, not intractable, without status migrainosus: Secondary | ICD-10-CM | POA: Insufficient documentation

## 2012-09-05 DIAGNOSIS — Z1151 Encounter for screening for human papillomavirus (HPV): Secondary | ICD-10-CM | POA: Insufficient documentation

## 2012-09-05 DIAGNOSIS — N809 Endometriosis, unspecified: Secondary | ICD-10-CM | POA: Insufficient documentation

## 2012-09-05 DIAGNOSIS — Z113 Encounter for screening for infections with a predominantly sexual mode of transmission: Secondary | ICD-10-CM

## 2012-09-05 DIAGNOSIS — N870 Mild cervical dysplasia: Secondary | ICD-10-CM | POA: Insufficient documentation

## 2012-09-05 NOTE — Progress Notes (Signed)
Patient came to see me today for her annual GYN exam. She is having regular cycles. She contraceptives with condoms. In 2010 she had a laparoscopy with endometriosis discovered. She has been asymptomatic since then. In 2007 patient had a colposcopy with biopsy showing CIN-1.She was observed.  For a while her Pap smears were normal. In 2011 after previous Pap showing ASCUS  A PAP was normal but high risk HPV was detected. She had a Pap smear after that in June, 2012 which was normal. She does her lab through her PCP.  Physical examination:Christie Camacho present. HEENT within normal limits. Neck: Thyroid not large. No masses. Supraclavicular nodes: not enlarged. Breasts: Examined in both sitting and lying  position. No skin changes and no masses. Abdomen: Soft no guarding rebound or masses or hernia. Pelvic: External: Within normal limits. BUS: Within normal limits. Vaginal:within normal limits. Good estrogen effect. No evidence of cystocele rectocele or enterocele. Cervix: clean. Uterus: Normal size and shape. Adnexa: No masses. Rectovaginal exam: Confirmatory and negative. Extremities: Within normal limits.  Assessment: #1. 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 #2. Endometriosis-now asymptomatic  Plan: Pap and high-risk HPV typing done. Continue condoms for birth control. GC and chlamydia cultures checked.

## 2012-09-05 NOTE — Patient Instructions (Signed)
We will let you know if any labs  abnormal.

## 2012-09-06 ENCOUNTER — Encounter: Payer: Self-pay | Admitting: Obstetrics and Gynecology

## 2012-09-06 LAB — URINALYSIS W MICROSCOPIC + REFLEX CULTURE
Bacteria, UA: NONE SEEN
Ketones, ur: NEGATIVE mg/dL
Leukocytes, UA: NEGATIVE
Nitrite: NEGATIVE
Protein, ur: NEGATIVE mg/dL
Urobilinogen, UA: 0.2 mg/dL (ref 0.0–1.0)

## 2012-09-06 LAB — GC/CHLAMYDIA PROBE AMP
CT Probe RNA: NEGATIVE
GC Probe RNA: NEGATIVE

## 2013-06-14 ENCOUNTER — Emergency Department (HOSPITAL_COMMUNITY)
Admission: EM | Admit: 2013-06-14 | Discharge: 2013-06-14 | Disposition: A | Discharge status: Home / Self Care | Payer: BC Managed Care – PPO | Source: Home / Self Care | Attending: Family Medicine | Admitting: Family Medicine

## 2013-06-14 ENCOUNTER — Encounter (HOSPITAL_COMMUNITY): Payer: Self-pay | Admitting: Emergency Medicine

## 2013-06-14 ENCOUNTER — Emergency Department (INDEPENDENT_AMBULATORY_CARE_PROVIDER_SITE_OTHER): Payer: BC Managed Care – PPO

## 2013-06-14 DIAGNOSIS — M84376A Stress fracture, unspecified foot, initial encounter for fracture: Secondary | ICD-10-CM

## 2013-06-14 DIAGNOSIS — M84374A Stress fracture, right foot, initial encounter for fracture: Secondary | ICD-10-CM

## 2013-06-14 NOTE — ED Provider Notes (Signed)
Christie Camacho is a 32 y.o. female who presents to Urgent Care today for right foot pain present for the last 3 weeks or so. Patient notes lateral foot pain. She has pain in the dorsal aspect of her foot near the cuboid bone. She denies any radiating pain weakness or numbness. She denies any injury. The pain is worse with activity better with rest. She has been doing a lot of standing on her feet and walking recently.  Past Medical History  Diagnosis Date  . CIN I (cervical intraepithelial neoplasia I)   . Migraines   . Eczema   . Endometriosis    History  Substance Use Topics  . Smoking status: Never Smoker   . Smokeless tobacco: Not on file  . Alcohol Use: Yes     Comment: Rare   ROS as above Medications reviewed. No current facility-administered medications for this encounter.   Current Outpatient Prescriptions  Medication Sig Dispense Refill  . Multiple Vitamins-Minerals (MULTIVITAMINS THER. W/MINERALS) TABS Take 1 tablet by mouth daily.        Exam:  BP 139/93  Pulse 84  Temp(Src) 98.9 F (37.2 C) (Oral)  Resp 18  SpO2 98%  LMP 06/09/2013 Gen: Well NAD RIGHT FOOT: High arch cavus appearing foot. Tender palpation proximal fourth metatarsal and cuboid. Normal foot motion capillary refill and sensation is intact distally. No crepitations or palpated.  No results found for this or any previous visit (from the past 24 hour(s)). Dg Foot Complete Right  06/14/2013   CLINICAL DATA:  Fourth metatarsal pain 3 days.  EXAM: RIGHT FOOT COMPLETE - 3+ VIEW  COMPARISON:  03/06/2012  FINDINGS: There is no evidence of fracture or dislocation. There is no evidence of arthropathy or other focal bone abnormality. Soft tissues are unremarkable.  IMPRESSION: Negative.   Electronically Signed   By: Elberta Fortis M.D.   On: 06/14/2013 12:15   Limited musculoskeletal ultrasound of the right dorsal foot. Fourth and fifth metatarsals evaluated showing no cortical disruption. There is a slight  hypoechoic change overlying the cuboid. This is indeterminate for stress fracture.  Assessment and Plan: 32 y.o. female with possible stress reaction of the proximal fourth metatarsal or cuboid. Plan to treat with postoperative shoe and followup with sports medicine. NSAIDs for pain as needed Discussed warning signs or symptoms. Please see discharge instructions. Patient expresses understanding.      Rodolph Bong, MD 06/14/13 [phone]

## 2013-06-14 NOTE — ED Notes (Signed)
Pt c/o pain at the top of right foot onset 3 days Denies: inj/trauma, strenuous activity Sxs include: constant pain that increases w/activity Has tried bengay and ice w/no relief Alert w/no signs of acute distress.

## 2013-07-15 ENCOUNTER — Ambulatory Visit (INDEPENDENT_AMBULATORY_CARE_PROVIDER_SITE_OTHER): Payer: Self-pay | Admitting: Women's Health

## 2013-07-15 ENCOUNTER — Encounter: Payer: Self-pay | Admitting: Women's Health

## 2013-07-15 DIAGNOSIS — A499 Bacterial infection, unspecified: Secondary | ICD-10-CM

## 2013-07-15 DIAGNOSIS — B9689 Other specified bacterial agents as the cause of diseases classified elsewhere: Secondary | ICD-10-CM

## 2013-07-15 DIAGNOSIS — N898 Other specified noninflammatory disorders of vagina: Secondary | ICD-10-CM

## 2013-07-15 DIAGNOSIS — N760 Acute vaginitis: Secondary | ICD-10-CM

## 2013-07-15 LAB — WET PREP FOR TRICH, YEAST, CLUE
Trich, Wet Prep: NONE SEEN
Yeast Wet Prep HPF POC: NONE SEEN

## 2013-07-15 MED ORDER — METRONIDAZOLE 0.75 % VA GEL: VAGINAL | Status: DC

## 2013-07-15 NOTE — Progress Notes (Signed)
Patient ID: Christie Camacho, female   DOB: May 28, 1981, 32 y.o.   MRN: [id number] Presents with complaint of pain deep in the vagina, questionable in the uterus. Discharge with odor on occasion. History of endometriosis diagnosed with laparoscopic surgery in 2010. Condoms for contraception. New partner in the past year. Denies urinary symptoms, dyspareunia or fever.  Exam: Appears well, abdomen soft nontender without radiation or rebound. External genitalia erythematous at introitus, speculum exam moderate amount of a white adherent discharge with odor noted, uncomfortable with exam. Wet prep positive for amines, clues, TNTC bacteria. GC/Chlamydia culture taken. Bimanual no CMT or adnexal fullness or tenderness, pain more localized in the vagina.  Bacteria vaginosis STD screen  Plan: MetroGel vaginal cream 1 applicator at bedtime x5, alcohol precautions reviewed. GC/Chlamydia culture pending. Will check HIV, hepatitis and RPR at annual exam in December. Contraception options reviewed and declined will continue condoms. Instructed to call if pain does not resolve.

## 2013-07-15 NOTE — Patient Instructions (Signed)
Bacterial Vaginosis  Bacterial vaginosis is an infection of the vagina. A healthy vagina has many kinds of good germs (bacteria). Sometimes the number of good germs can change. This allows bad germs to move in and cause an infection. You may be given medicine (antibiotics) to treat the infection. Or, you may not need treatment at all.  HOME CARE   Take your medicine as told. Finish them even if you start to feel better.   Do not have sex until you finish your medicine.   Do not douche.   Practice safe sex.   Tell your sex partner that you have an infection. They should see their doctor for treatment if they have problems.  GET HELP RIGHT AWAY IF:   You do not get better after 3 days of treatment.   You have grey fluid (discharge) coming from your vagina.   You have pain.   You have a temperature of 102 F (38.9 C) or higher.  MAKE SURE YOU:    Understand these instructions.   Will watch your condition.   Will get help right away if you are not doing well or get worse.  Document Released: 06/06/2008 Document Revised: 11/20/2011 Document Reviewed: 06/06/2008  [employer] Patient Information 2014 [employer]

## 2013-07-16 LAB — GC/CHLAMYDIA PROBE AMP: GC Probe RNA: NEGATIVE

## 2013-12-02 ENCOUNTER — Other Ambulatory Visit (HOSPITAL_COMMUNITY)
Admission: RE | Admit: 2013-12-02 | Discharge: 2013-12-02 | Disposition: A | Discharge status: Home / Self Care | Payer: BC Managed Care – PPO | Source: Ambulatory Visit | Attending: Gynecology | Admitting: Gynecology

## 2013-12-02 ENCOUNTER — Encounter: Payer: Self-pay | Admitting: Gynecology

## 2013-12-02 ENCOUNTER — Ambulatory Visit (INDEPENDENT_AMBULATORY_CARE_PROVIDER_SITE_OTHER): Payer: BC Managed Care – PPO | Admitting: Gynecology

## 2013-12-02 VITALS — BP 132/88 | Ht 61.25 in | Wt 154.0 lb

## 2013-12-02 DIAGNOSIS — Z01419 Encounter for gynecological examination (general) (routine) without abnormal findings: Secondary | ICD-10-CM

## 2013-12-02 DIAGNOSIS — Z87410 Personal history of cervical dysplasia: Secondary | ICD-10-CM

## 2013-12-02 DIAGNOSIS — N946 Dysmenorrhea, unspecified: Secondary | ICD-10-CM

## 2013-12-02 DIAGNOSIS — Z8742 Personal history of other diseases of the female genital tract: Secondary | ICD-10-CM

## 2013-12-02 MED ORDER — MEFENAMIC ACID 250 MG PO CAPS: 250.0000 mg | ORAL_CAPSULE | Freq: Four times a day (QID) | ORAL | Status: DC | PRN

## 2013-12-02 NOTE — Progress Notes (Addendum)
Christie Camacho February 21, 1981 [id number]   History:    33 y.o.  for annual gynecological examination and was complaining of dysmenorrhea.She is having regular cycles. She contraceptives with condoms. In 2010 she had a laparoscopy with endometriosis discovered. She has been asymptomatic since then. In 2007 patient had a colposcopy with biopsy showing CIN-1.She was observed. For a while her Pap smears were normal. 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 She had a Pap smear after that in 2013 which was normal. She does her lap to her PCP.  Patient in 2007/2008 completed her HPV vaccine series  Past medical history,surgical history, family history and social history were all reviewed and documented in the EPIC chart.  Gynecologic History Patient's last menstrual period was 11/13/2013. Contraception: condoms Last Pap: 2013. Results were: normal Last mammogram: Not indicated. Results were: Not indicated  Obstetric History OB History  Gravida Para Term Preterm AB SAB TAB Ectopic Multiple Living  0                  ROS: A ROS was performed and pertinent positives and negatives are included in the history.  GENERAL: No fevers or chills. HEENT: No change in vision, no earache, sore throat or sinus congestion. NECK: No pain or stiffness. CARDIOVASCULAR: No chest pain or pressure. No palpitations. PULMONARY: No shortness of breath, cough or wheeze. GASTROINTESTINAL: No abdominal pain, nausea, vomiting or diarrhea, melena or bright red blood per rectum. GENITOURINARY: No urinary frequency, urgency, hesitancy or dysuria. MUSCULOSKELETAL: No joint or muscle pain, no back pain, no recent trauma. DERMATOLOGIC: No rash, no itching, no lesions. ENDOCRINE: No polyuria, polydipsia, no heat or cold intolerance. No recent change in weight. HEMATOLOGICAL: No anemia or easy bruising or bleeding. NEUROLOGIC: No headache, seizures, numbness, tingling or weakness.  PSYCHIATRIC: No depression, no loss of interest in normal activity or change in sleep pattern.     Exam: chaperone present  BP 132/88  Ht 5' 1.25" (1.556 m)  Wt 154 lb (69.854 kg)  BMI 28.85 kg/m2  LMP 11/13/2013  Body mass index is 28.85 kg/(m^2).  General appearance : Well developed well nourished female. No acute distress HEENT: Neck supple, trachea midline, no carotid bruits, no thyroidmegaly Lungs: Clear to auscultation, no rhonchi or wheezes, or rib retractions  Heart: Regular rate and rhythm, no murmurs or gallops Breast:Examined in sitting and supine position were symmetrical in appearance, no palpable masses or tenderness,  no skin retraction, no nipple inversion, no nipple discharge, no skin discoloration, no axillary or supraclavicular lymphadenopathy Abdomen: no palpable masses or tenderness, no rebound or guarding Extremities: no edema or skin discoloration or tenderness  Pelvic:  Bartholin, Urethra, Skene Glands: Within normal limits             Vagina: No gross lesions or discharge  Cervix: No gross lesions or discharge  Uterus  anteverted, normal size, shape and consistency, non-tender and mobile  Adnexa  Without masses or tenderness  Anus and perineum  normal   Rectovaginal  normal sphincter tone without palpated masses or tenderness             Hemoccult not indicated     Assessment/Plan:  33 y.o. female for annual exam with history of CIN-1 with high-risk HPV in the past. We will do a Pap smear today and begin to adhere to the new guidelines. Patient with past history of endometriosis has been asymptomatic since her surgery  with the exception of dysmenorrhea. She is going to be started on Ponstel 250 mg 4 times a day during her cycle for her dysmenorrhea. Her PCP will be doing her blood work. Information on breast exam was provided.  Note: This dictation was prepared with  Dragon/digital dictation along withSmart phrase technology. Any transcriptional errors  that result from this process are unintentional.   Terrance Mass MD, 10:56 AM 12/02/2013

## 2013-12-02 NOTE — Patient Instructions (Signed)

## 2013-12-02 NOTE — Addendum Note (Signed)
Addended by: Su Grand A on: 12/02/2013 11:28 AM   Modules accepted: Orders

## 2014-01-04 IMAGING — US US ABDOMEN COMPLETE
1 series · 14 of 25 positions shown · non-contrast
Comparison: CT [DATE]

CLINICAL DATA: Abdominal pain.  Evaluate for cholecystitis.
Nausea and vomiting

COMPLETE ABDOMINAL ULTRASOUND

[Series 1: us abdomen complete · 0.28mm/px · 14 of 77 slices shown]
[im 1/77]
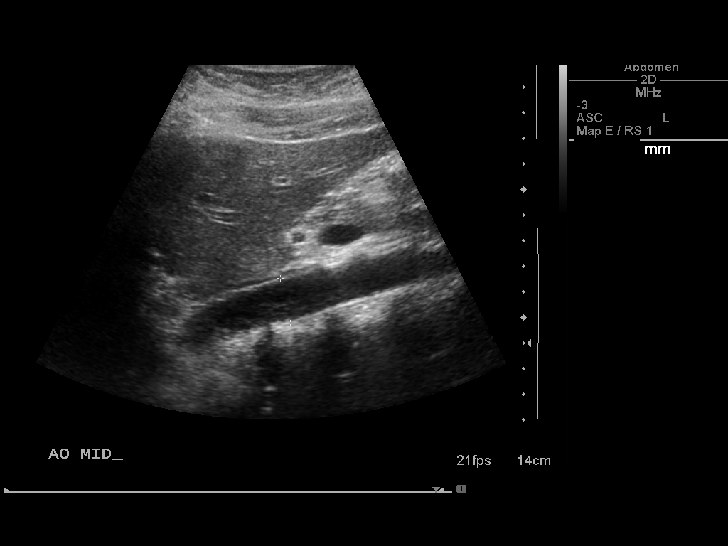
[im 7/77]
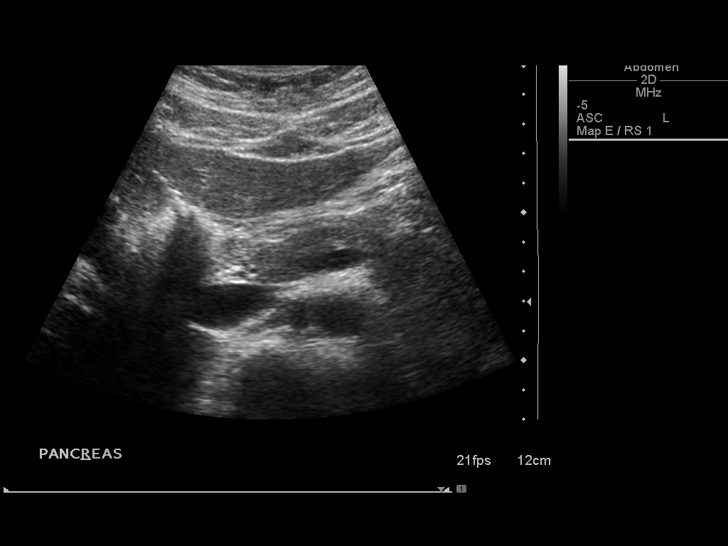
[im 13/77]
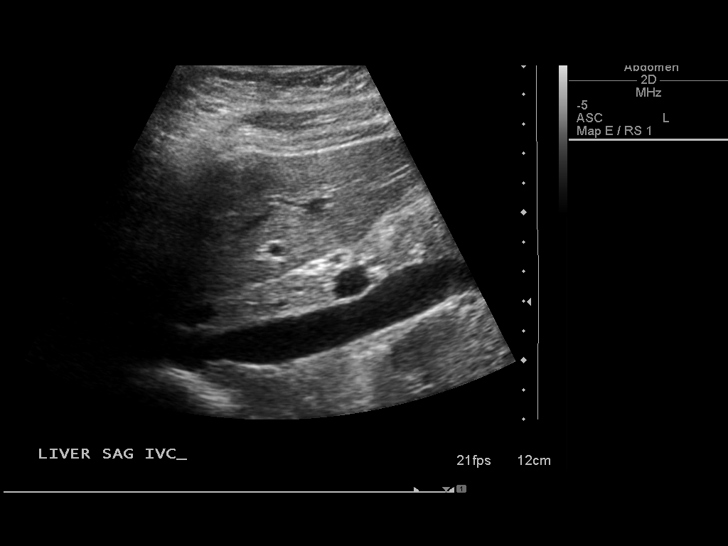
[im 20/77]
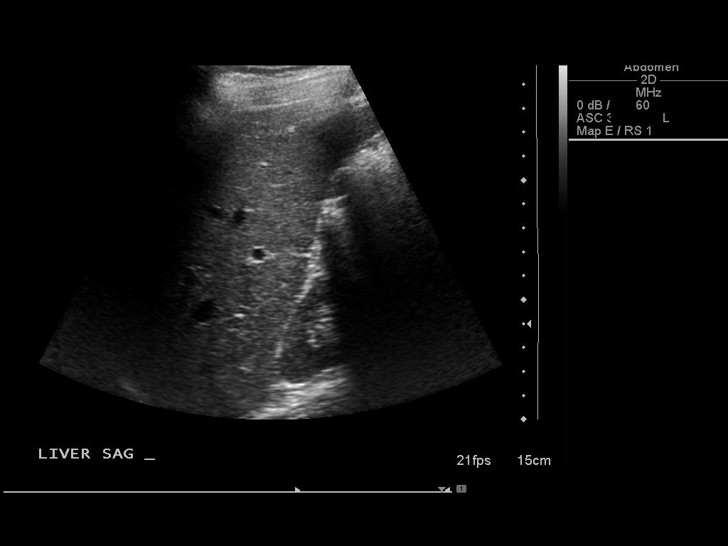
[im 26/77]
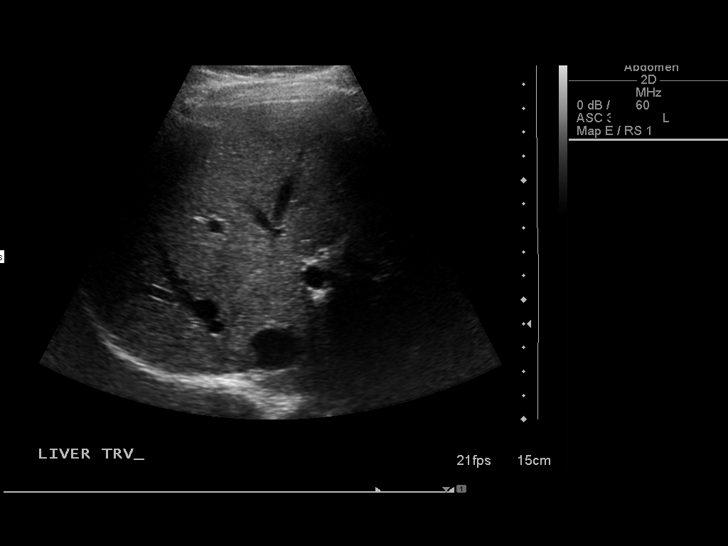
[im 29/77]
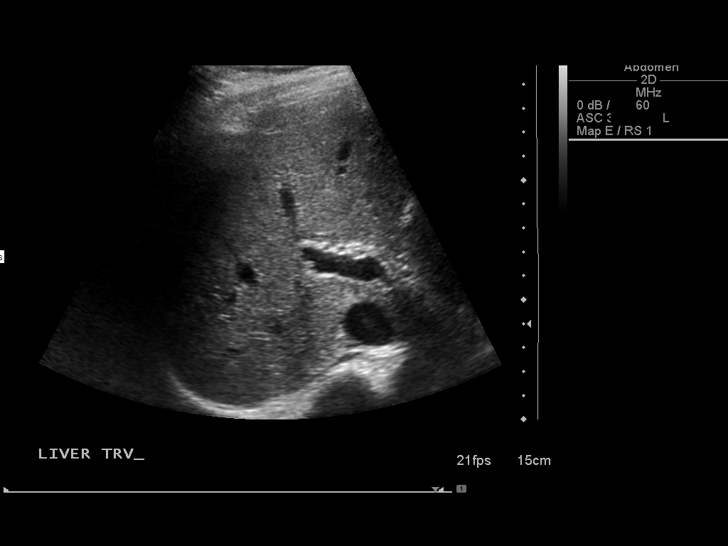
[im 35/77]
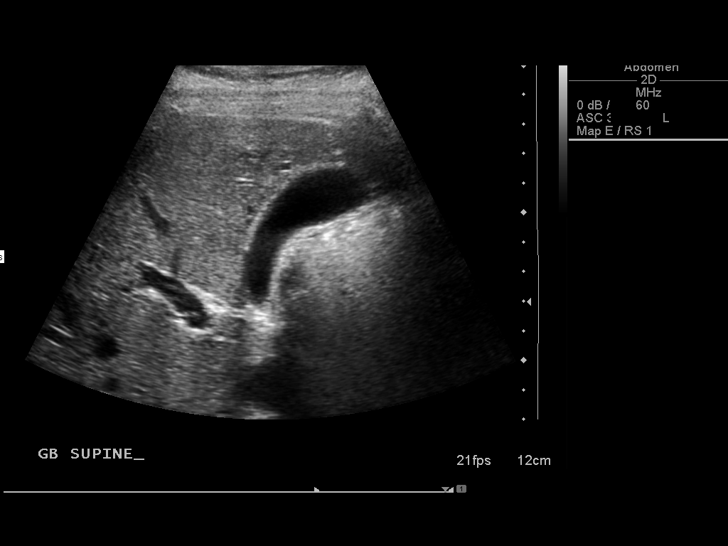
[im 42/77]
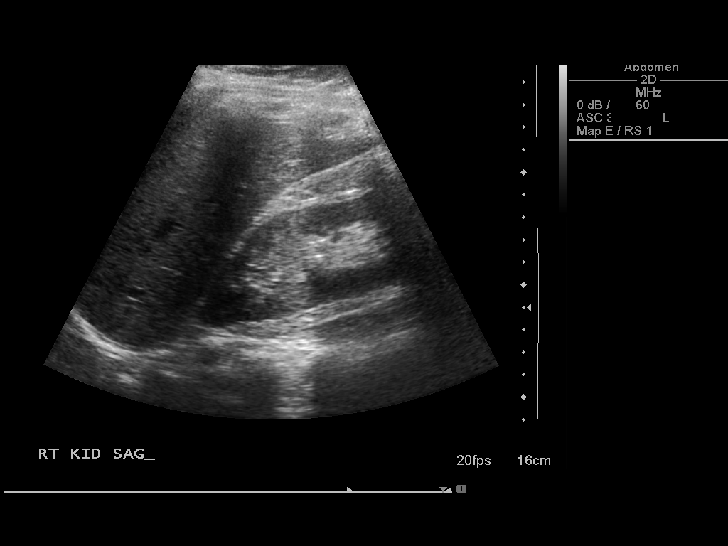
[im 48/77]
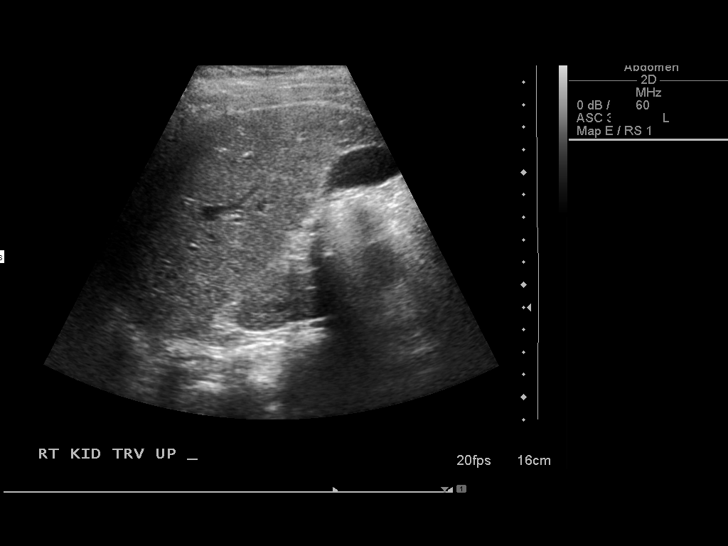
[im 51/77]
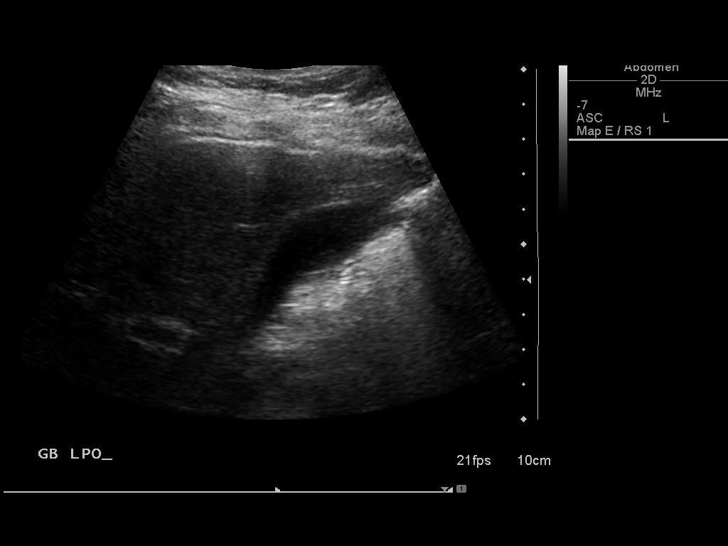
[im 58/77]
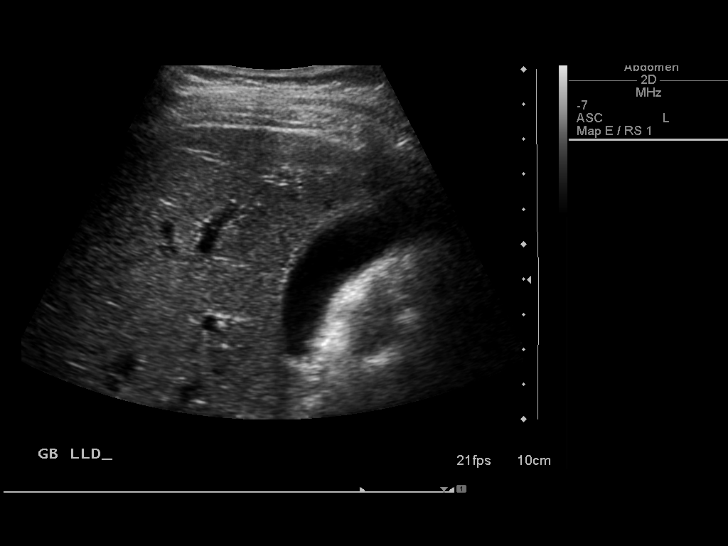
[im 64/77]
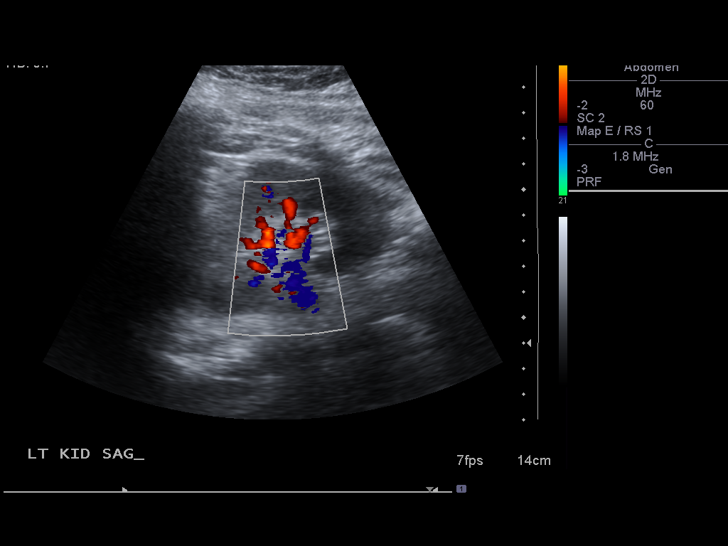
[im 70/77]
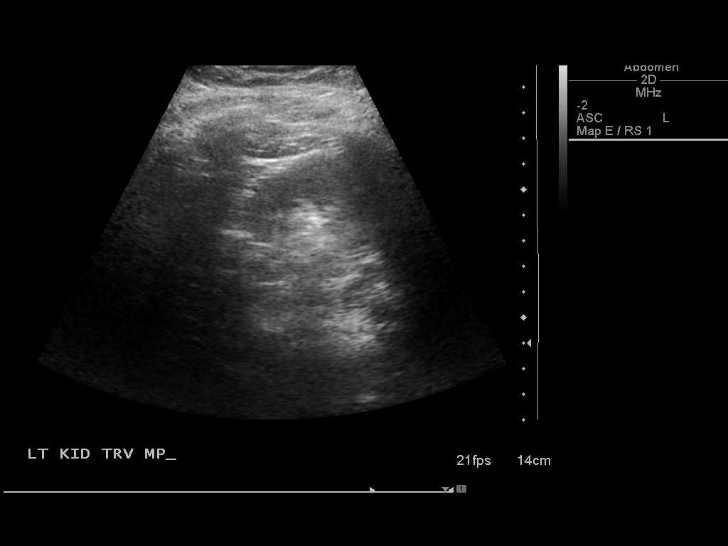
[im 77/77]
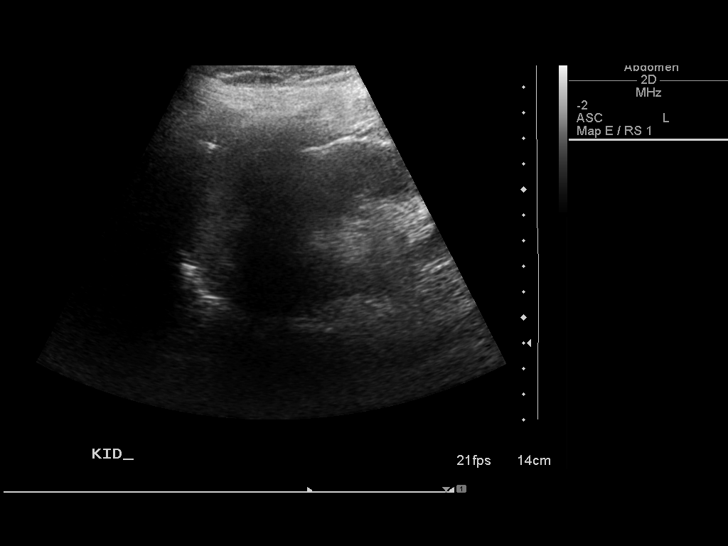

[14 of 25 positions shown; findings below may reference images not displayed]

FINDINGS: Gallbladder:  No gallstones, gallbladder wall thickening, or
pericholecystic fluid. Evaluation for a sonographic Murphy's sign
is negative

Common bile duct:  Measures 5.1 mm in diameter and has a normal
appearance

Liver:  Appears homogeneous in echotexture with no focal
parenchymal abnormality or signs of intrahepatic ductal dilatation
noted

IVC:  The proximal portion appears normal

Pancreas:  Appears normal in size and echotexture

Spleen:  Has a sagittal length of 4.0 cm.  No focal parenchymal
abnormalities are seen

Right Kidney:  Has a sagittal length of 12.1 cm. No focal
parenchymal abnormality or signs of hydronephrosis are evident

Left Kidney:  Demonstrates a sagittal length of 10.1 cm.  No focal
parenchymal abnormality or signs of hydronephrosis are evident

Abdominal aorta:  Has a maximal caliber of 1.8 cm with no
aneurysmal dilatation identified.
IMPRESSION: Unremarkable abdominal ultrasound

## 2014-02-20 ENCOUNTER — Emergency Department (HOSPITAL_COMMUNITY)
Admission: EM | Admit: 2014-02-20 | Discharge: 2014-02-21 | Disposition: A | Discharge status: Home / Self Care | Payer: BC Managed Care – PPO | Attending: Emergency Medicine | Admitting: Emergency Medicine

## 2014-02-20 ENCOUNTER — Encounter (HOSPITAL_COMMUNITY): Payer: Self-pay | Admitting: Emergency Medicine

## 2014-02-20 DIAGNOSIS — G43909 Migraine, unspecified, not intractable, without status migrainosus: Secondary | ICD-10-CM | POA: Insufficient documentation

## 2014-02-20 DIAGNOSIS — Z91040 Latex allergy status: Secondary | ICD-10-CM | POA: Insufficient documentation

## 2014-02-20 DIAGNOSIS — Z3202 Encounter for pregnancy test, result negative: Secondary | ICD-10-CM | POA: Insufficient documentation

## 2014-02-20 DIAGNOSIS — Z872 Personal history of diseases of the skin and subcutaneous tissue: Secondary | ICD-10-CM | POA: Insufficient documentation

## 2014-02-20 DIAGNOSIS — Z8742 Personal history of other diseases of the female genital tract: Secondary | ICD-10-CM | POA: Insufficient documentation

## 2014-02-20 DIAGNOSIS — Z7982 Long term (current) use of aspirin: Secondary | ICD-10-CM | POA: Insufficient documentation

## 2014-02-20 MED ORDER — SODIUM CHLORIDE 0.9 % IV BOLUS (SEPSIS)
1000.0000 mL | Freq: Once | INTRAVENOUS | Status: AC
  Administered 2014-02-20: 1000 mL via INTRAVENOUS

## 2014-02-20 MED ORDER — KETOROLAC TROMETHAMINE 30 MG/ML IJ SOLN
30.0000 mg | Freq: Once | INTRAMUSCULAR | Status: AC
  Administered 2014-02-20: 30 mg via INTRAVENOUS
  Filled 2014-02-20: qty 1

## 2014-02-20 MED ORDER — ONDANSETRON HCL 4 MG/2ML IJ SOLN
4.0000 mg | Freq: Once | INTRAMUSCULAR | Status: AC
  Administered 2014-02-20: 4 mg via INTRAVENOUS
  Filled 2014-02-20: qty 2

## 2014-02-20 NOTE — ED Notes (Signed)
Pt reports nausea and emesis that began at 1200 today. Pt reports that she developed a headache at 1600. Pt reports a history of migraine headaches. Pt denies abdominal pain, vaginal bleeding, and vaginal discharge. Pt is A/O x4, in NAD, and vitals are WDL.

## 2014-02-21 LAB — URINALYSIS, ROUTINE W REFLEX MICROSCOPIC
Bilirubin Urine: NEGATIVE
GLUCOSE, UA: NEGATIVE mg/dL
HGB URINE DIPSTICK: NEGATIVE
Ketones, ur: 40 mg/dL — AB
Leukocytes, UA: NEGATIVE
Nitrite: NEGATIVE
PH: 7.5 (ref 5.0–8.0)
PROTEIN: 30 mg/dL — AB
Specific Gravity, Urine: 1.028 (ref 1.005–1.030)
Urobilinogen, UA: 1 mg/dL (ref 0.0–1.0)

## 2014-02-21 LAB — CBC
HCT: 42.7 % (ref 36.0–46.0)
Hemoglobin: 14.2 g/dL (ref 12.0–15.0)
MCH: 27 pg (ref 26.0–34.0)
MCHC: 33.3 g/dL (ref 30.0–36.0)
MCV: 81.2 fL (ref 78.0–100.0)
PLATELETS: 217 10*3/uL (ref 150–400)
RBC: 5.26 MIL/uL — AB (ref 3.87–5.11)
RDW: 13.6 % (ref 11.5–15.5)
WBC: 6 10*3/uL (ref 4.0–10.5)

## 2014-02-21 LAB — COMPREHENSIVE METABOLIC PANEL
ALBUMIN: 4.4 g/dL (ref 3.5–5.2)
ALT: 16 U/L (ref 0–35)
AST: 23 U/L (ref 0–37)
Alkaline Phosphatase: 70 U/L (ref 39–117)
BILIRUBIN TOTAL: 0.4 mg/dL (ref 0.3–1.2)
BUN: 9 mg/dL (ref 6–23)
CALCIUM: 9.7 mg/dL (ref 8.4–10.5)
CHLORIDE: 100 meq/L (ref 96–112)
CO2: 25 meq/L (ref 19–32)
Creatinine, Ser: 0.75 mg/dL (ref 0.50–1.10)
GFR calc Af Amer: >90 mL/min (ref >90)
Glucose, Bld: 104 mg/dL — ABNORMAL HIGH (ref 70–99)
Potassium: 3.9 mEq/L (ref 3.7–5.3)
SODIUM: 140 meq/L (ref 137–147)
Total Protein: 8.1 g/dL (ref 6.0–8.3)

## 2014-02-21 LAB — URINE MICROSCOPIC-ADD ON

## 2014-02-21 LAB — LIPASE, BLOOD: Lipase: 28 U/L (ref 11–59)

## 2014-02-21 LAB — POC URINE PREG, ED: PREG TEST UR: NEGATIVE

## 2014-02-21 MED ORDER — PROMETHAZINE HCL 25 MG/ML IJ SOLN
25.0000 mg | Freq: Once | INTRAMUSCULAR | Status: AC
  Administered 2014-02-21: 25 mg via INTRAVENOUS
  Filled 2014-02-21: qty 1

## 2014-02-21 NOTE — ED Provider Notes (Signed)
Medical screening examination/treatment/procedure(s) were performed by non-physician practitioner and as supervising physician I was immediately available for consultation/collaboration.   EKG Interpretation None       Varney Biles, MD 02/21/14 0405

## 2014-02-21 NOTE — Discharge Instructions (Signed)
1. Medications: usual home medications 2. Treatment: rest, drink plenty of fluids,  3. Follow Up: Please followup with your primary doctor for discussion of your diagnoses and further evaluation after today's visit;    Migraine Headache A migraine headache is an intense, throbbing pain on one or both sides of your head. A migraine can last for 30 minutes to several hours. CAUSES  The exact cause of a migraine headache is not always known. However, a migraine may be caused when nerves in the brain become irritated and release chemicals that cause inflammation. This causes pain. Certain things may also trigger migraines, such as:  Alcohol.  Smoking.  Stress.  Menstruation.  Aged cheeses.  Foods or drinks that contain nitrates, glutamate, aspartame, or tyramine.  Lack of sleep.  Chocolate.  Caffeine.  Hunger.  Physical exertion.  Fatigue.  Medicines used to treat chest pain (nitroglycerine), birth control pills, estrogen, and some blood pressure medicines. SIGNS AND SYMPTOMS  Pain on one or both sides of your head.  Pulsating or throbbing pain.  Severe pain that prevents daily activities.  Pain that is aggravated by any physical activity.  Nausea, vomiting, or both.  Dizziness.  Pain with exposure to bright lights, loud noises, or activity.  General sensitivity to bright lights, loud noises, or smells. Before you get a migraine, you may get warning signs that a migraine is coming (aura). An aura may include:  Seeing flashing lights.  Seeing bright spots, halos, or zig-zag lines.  Having tunnel vision or blurred vision.  Having feelings of numbness or tingling.  Having trouble talking.  Having muscle weakness. DIAGNOSIS  A migraine headache is often diagnosed based on:  Symptoms.  Physical exam.  A CT scan or MRI of your head. These imaging tests cannot diagnose migraines, but they can help rule out other causes of headaches. TREATMENT Medicines  may be given for pain and nausea. Medicines can also be given to help prevent recurrent migraines.  HOME CARE INSTRUCTIONS  Only take over-the-counter or prescription medicines for pain or discomfort as directed by your health care provider. The use of long-term narcotics is not recommended.  Lie down in a dark, quiet room when you have a migraine.  Keep a journal to find out what may trigger your migraine headaches. For example, write down:  What you eat and drink.  How much sleep you get.  Any change to your diet or medicines.  Limit alcohol consumption.  Quit smoking if you smoke.  Get 7 9 hours of sleep, or as recommended by your health care provider.  Limit stress.  Keep lights dim if bright lights bother you and make your migraines worse. SEEK IMMEDIATE MEDICAL CARE IF:   Your migraine becomes severe.  You have a fever.  You have a stiff neck.  You have vision loss.  You have muscular weakness or loss of muscle control.  You start losing your balance or have trouble walking.  You feel faint or pass out.  You have severe symptoms that are different from your first symptoms. MAKE SURE YOU:   Understand these instructions.  Will watch your condition.  Will get help right away if you are not doing well or get worse. Document Released: 08/28/2005 Document Revised: 06/18/2013 Document Reviewed: 05/05/2013 [facility] Patient Information 2014 [place].

## 2014-02-21 NOTE — ED Provider Notes (Signed)
CSN: [id number]     Arrival date & time 02/20/14  2211 History   First MD Initiated Contact with Patient 02/20/14 2240     Chief Complaint  Patient presents with  . Emesis  . Headache     (Consider location/radiation/quality/duration/timing/severity/associated sxs/prior Treatment) Patient is a 33 y.o. female presenting with vomiting and headaches. The history is provided by the patient and medical records. No language interpreter was used.  Emesis Associated symptoms: headaches   Associated symptoms: no abdominal pain and no diarrhea   Headache Associated symptoms: nausea and vomiting   Associated symptoms: no abdominal pain, no back pain, no cough, no diarrhea, no fatigue, no fever and no neck stiffness     Christie Camacho is a 33 y.o. female  with a hx of migraine headache presents to the Emergency Department complaining of gradual, persistent, progressively worsening headache onset 4 PM today. Associated symptoms include nausea and vomiting without abdominal pain or diarrhea beginning at noon today.  Patient denies sick contacts. She has not attempted to take any over-the-counter medications for symptoms. Patient reports that sometimes she takes Excedrin Migraine for her headaches but did not take any today. Aggravating or alleviating factors. Patient denies fever, chills, neck pain, neck stiffness, chest pain, shortness of breath, abdominal pain, diarrhea, weakness, dizziness, syncope, dysuria, hematuria. Last menstrual cycle 02/10/2014.  Patient reports emesis is nonbloody, nonbilious. Patient denies sick contacts.   Past Medical History  Diagnosis Date  . CIN I (cervical intraepithelial neoplasia I)   . Migraines   . Eczema   . Endometriosis    Past Surgical History  Procedure Laterality Date  . Pelvic laparoscopy  09/28/08    DIAGNOSTIC LAPAROSCOPY  . Colposcopy     Family History  Problem Relation Age of Onset  . Hypertension Mother   . Diabetes Father   . Hypertension  Father   . Cancer Maternal Grandmother     STOMACH TUMOR   History  Substance Use Topics  . Smoking status: Never Smoker   . Smokeless tobacco: Not on file  . Alcohol Use: Yes     Comment: Rare   OB History   Grav Para Term Preterm Abortions TAB SAB Ect Mult Living   0              Review of Systems  Constitutional: Negative for fever, diaphoresis, appetite change, fatigue and unexpected weight change.  HENT: Negative for mouth sores.   Eyes: Negative for visual disturbance.  Respiratory: Negative for cough, chest tightness, shortness of breath and wheezing.   Cardiovascular: Negative for chest pain.  Gastrointestinal: Positive for nausea and vomiting. Negative for abdominal pain, diarrhea and constipation.  Endocrine: Negative for polydipsia, polyphagia and polyuria.  Genitourinary: Negative for dysuria, urgency, frequency and hematuria.  Musculoskeletal: Negative for back pain and neck stiffness.  Skin: Negative for rash.  Allergic/Immunologic: Negative for immunocompromised state.  Neurological: Positive for headaches. Negative for syncope and light-headedness.  Hematological: Does not bruise/bleed easily.  Psychiatric/Behavioral: Negative for sleep disturbance. The patient is not nervous/anxious.       Allergies  Latex  Home Medications   Prior to Admission medications   Medication Sig Start Date End Date Taking? Authorizing Provider  aspirin-acetaminophen-caffeine (EXCEDRIN MIGRAINE) [phone] MG per tablet Take 1 tablet by mouth every 6 (six) hours as needed for headache.   Yes Historical Provider, MD  Mefenamic Acid 250 MG CAPS Take 1 capsule (250 mg total) by mouth 4 (four) times daily as needed. 12/02/13  Yes Terrance Mass, MD   BP 133/90  Pulse 69  Temp(Src) 99.1 F (37.3 C) (Oral)  Resp 14  SpO2 100%  LMP 02/10/2014 Physical Exam  Nursing note and vitals reviewed. Constitutional: She is oriented to person, place, and time. She appears well-developed  and well-nourished. No distress.  HENT:  Head: Normocephalic and atraumatic.  Mouth/Throat: Oropharynx is clear and moist.  Eyes: Conjunctivae and EOM are normal. Pupils are equal, round, and reactive to light. No scleral icterus.  No horizontal, vertical or rotational nystagmus  Neck: Normal range of motion. Neck supple.  Full active and passive ROM without pain No midline or paraspinal tenderness No nuchal rigidity or meningeal signs  Cardiovascular: Normal rate, regular rhythm and intact distal pulses.   Pulmonary/Chest: Effort normal and breath sounds normal. No respiratory distress. She has no wheezes. She has no rales.  Abdominal: Soft. Bowel sounds are normal. There is no tenderness. There is no rebound, no guarding and no CVA tenderness.  Abdomen soft and nontender No CVA tenderness  Musculoskeletal: Normal range of motion.  Lymphadenopathy:    She has no cervical adenopathy.  Neurological: She is alert and oriented to person, place, and time. She has normal reflexes. No cranial nerve deficit. She exhibits normal muscle tone. Coordination normal.  Mental Status:  Alert, oriented, thought content appropriate. Speech fluent without evidence of aphasia. Able to follow 2 step commands without difficulty.  Cranial Nerves:  II:  Peripheral visual fields grossly normal, pupils equal, round, reactive to light III,IV, VI: ptosis not present, extra-ocular motions intact bilaterally  V,VII: smile symmetric, facial light touch sensation equal VIII: hearing grossly normal bilaterally  IX,X: gag reflex present  XI: bilateral shoulder shrug equal and strong XII: midline tongue extension  Motor:  5/5 in upper and lower extremities bilaterally including strong and equal grip strength and dorsiflexion/plantar flexion Sensory: Pinprick and light touch normal in all extremities.  Deep Tendon Reflexes: 2+ and symmetric  Cerebellar: normal finger-to-nose with bilateral upper extremities Gait:  normal gait and balance CV: distal pulses palpable throughout   Skin: Skin is warm and dry. No rash noted. She is not diaphoretic.  Psychiatric: She has a normal mood and affect. Her behavior is normal. Judgment and thought content normal.    ED Course  Procedures (including critical care time) Labs Review Labs Reviewed  CBC - Abnormal; Notable for the following:    RBC 5.26 (*)    All other components within normal limits  COMPREHENSIVE METABOLIC PANEL - Abnormal; Notable for the following:    Glucose, Bld 104 (*)    All other components within normal limits  URINALYSIS, ROUTINE W REFLEX MICROSCOPIC - Abnormal; Notable for the following:    Ketones, ur 40 (*)    Protein, ur 30 (*)    All other components within normal limits  URINE MICROSCOPIC-ADD ON - Abnormal; Notable for the following:    Bacteria, UA FEW (*)    All other components within normal limits  LIPASE, BLOOD  POC URINE PREG, ED    Imaging Review No results found.   EKG Interpretation None      MDM   Final diagnoses:  Migraine headache   Christie Camacho presents with nausea and vomiting and associated headache developing approximately 4 hours after the onset of nausea and vomiting. She denies abdominal pain, fevers or nuchal rigidity. Patient with normal neurologic exam. Will give migraine cocktail and reassess.  1:21 AM Patient reports complete resolution of pain and  feeling significantly better. She reports improvement in nausea but without resolution. She requests another medication.  She reports Phenergan has worked in the past.  Will give Phenergan and discharged home. She declines home prescription of nausea medications.  I have personally reviewed patient's vitals, nursing note and any pertinent labs or imaging.  I performed an undressed physical exam.    At this time, it has been determined that no acute conditions requiring further emergency intervention. The patient/guardian have been advised of  the diagnosis and plan. I reviewed all labs and imaging including any potential incidental findings. We have discussed signs and symptoms that warrant return to the ED, such as high fevers, nuchal rigidity, confusion or other concerning symptoms such as hematemesis.  Patient/guardian has voiced understanding and agreed to follow-up with the PCP or specialist in 3 days.  Vital signs are stable at discharge.   BP 133/90  Pulse 69  Temp(Src) 99.1 F (37.3 C) (Oral)  Resp 14  SpO2 100%  LMP 02/10/2014        Abigail Butts, PA-C 02/21/14 [place], PA-C 02/21/14 0133

## 2014-03-24 ENCOUNTER — Encounter: Payer: Self-pay | Admitting: Gynecology

## 2014-03-24 ENCOUNTER — Ambulatory Visit (INDEPENDENT_AMBULATORY_CARE_PROVIDER_SITE_OTHER): Payer: BC Managed Care – PPO | Admitting: Gynecology

## 2014-03-24 VITALS — BP 118/76

## 2014-03-24 DIAGNOSIS — N911 Secondary amenorrhea: Secondary | ICD-10-CM

## 2014-03-24 DIAGNOSIS — Z3490 Encounter for supervision of normal pregnancy, unspecified, unspecified trimester: Secondary | ICD-10-CM | POA: Insufficient documentation

## 2014-03-24 DIAGNOSIS — N912 Amenorrhea, unspecified: Secondary | ICD-10-CM

## 2014-03-24 DIAGNOSIS — Z331 Pregnant state, incidental: Secondary | ICD-10-CM

## 2014-03-24 LAB — HCG, QUANTITATIVE, PREGNANCY: hCG, Beta Chain, Quant, S: 127.2 m[IU]/mL

## 2014-03-24 LAB — PREGNANCY, URINE: PREG TEST UR: POSITIVE

## 2014-03-24 NOTE — Progress Notes (Signed)
   Patient is a 33 year old who presented to the office today stating that her last menstrual period was in June of this year. Hard to that patient was having normal menstrual cycle and was using condoms for contraception but not recently.In 2010 she had a laparoscopy with endometriosis discovered. She has been asymptomatic since then. In 2007 patient had a colposcopy with biopsy showing CIN-1.She was observed. For a while her Pap smears were normal. 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 She had a Pap smear after that in 2013 which was normal. The patient has complained of some mild cramping but no bleeding, breast tenderness, nausea, or vomiting.  Patient several years ago had PID and was treated  Exam: Abdomen: Soft nontender no rebound or guarding Pelvic: Urethra Skene was within normal limits Vagina: No lesions or discharge Cervix: No lesions or discharge Uterus slightly retroverted 4-6 week size nontender Adnexa: No viable passer to Rectal exam: Not done  Urine pregnancy test positive  Assessment/plan: Secondary amenorrhea attributed to pregnancy. Urine pregnancy test positive. Patient will have a quantitative beta-hCG today and will repeat at the end of the week with a followup ultrasound next week depending on levels. We discussed that she is a high-risk for an ectopic pregnancy with her past history of laparoscopy and treatment for endometriosis as well as past history of PID. She was instructed to begin taking prenatal vitamins. If her dates are correct to be approximately 6-1/[redacted] weeks gestation today with a due date of 11/19/2014.

## 2014-03-24 NOTE — Patient Instructions (Signed)
Pregnancy If you are planning on getting pregnant, it is a good idea to make a preconception appointment with your health care provider to discuss having a healthy lifestyle before getting pregnant. This includes diet, weight, exercise, taking prenatal vitamins (especially folic acid, which helps prevent brain and spinal cord defects), avoiding alcohol, quitting smoking and illegal drugs, discussing medical problems (diabetes, heart disease, convulsions) and family history of genetic problems, your working conditions, and your immunizations. It is better to have knowledge of these things and do something about them before getting pregnant.  During your pregnancy, it is important to follow certain guidelines in order to have a healthy baby. It is very important to get good prenatal care and follow your health care provider's instructions. Prenatal care includes all the medical care you receive before your baby's birth. This helps to prevent problems during the pregnancy and childbirth.  HOME CARE INSTRUCTIONS   Start your prenatal visits by the 12th week of pregnancy or earlier, if possible. At first, appointments are usually scheduled monthly. They become more frequent in the last 2 months before delivery. It is important that you keep your health care provider's appointments and follow his or her instructions regarding medicine use, exercise, and diet.  During pregnancy, you are providing food for you and your baby. Eat a regular, well-balanced diet. Choose foods such as meat, fish, milk and other dairy products, vegetables, fruits, and whole-grain breads and cereals. Your health care provider will inform you of your ideal weight gain during pregnancy depending on your current height and weight. Drink plenty of fluids. Try to drink 8 glasses of water a day.  Alcohol is related to a number of birth defects, including fetal alcohol syndrome. It is best to avoid alcohol completely. Smoking will cause low  birth rate and prematurity. Use of alcohol and nicotine during your pregnancy also increases the chances that your child will be chemically dependent later in life and may contribute to sudden infant death syndrome, or SIDS.  Do not use illegal drugs.  Only take medicines as directed by your health care provider. Some medicines can cause genetic and physical problems in your baby.  Morning sickness can often be helped by keeping soda crackers at the bedside. Eat a few crackers before getting up in the morning.  A sexual relationship may be continued until near the end of your pregnancy if there are no other problems such as early (premature) leaking of amniotic fluid from the membranes, vaginal bleeding, painful intercourse, or abdominal pain.  Exercise regularly. Check with your health care provider if you are unsure about whether your exercises are safe.  Do not use hot tubs, steam rooms, or saunas. These increase the risk of fainting and you hurting yourself and your baby. Swimming is okay for exercise. Get plenty of rest, including afternoon naps when possible, especially in the third trimester.  Avoid toxic odors and chemicals.  Do not wear high heels. They may cause you to lose your balance and fall.  Do not lift over 5 pounds (2.3 kg). If you do lift anything, lift with your legs and thighs, not your back.  Avoid traveling, especially in the third trimester. If you have to travel out of the city or state, take a copy of your medical records with you.  Learn about, and consider, breastfeeding your baby. SEEK IMMEDIATE MEDICAL CARE IF:   You have a fever.  You have leaking of fluid from your vagina. If you think your water  broke, take your temperature and tell your health care provider of this when you call.  You have vaginal spotting or bleeding. Tell your health care provider of the amount and how many pads are used.  You continue to feel nauseous and have no relief from  remedies suggested, or you vomit blood or coffee ground-like materials.  You have upper abdominal pain.  You have round ligament discomfort in the lower abdominal area. This still must be evaluated by your health care provider.  You feel contractions.  You do not feel the baby move, or there is less movement than before.  You have painful urination.  You have abnormal vaginal discharge.  You have persistent diarrhea.  You have a severe headache.  You have problems with your vision.  You have muscle weakness.  You feel dizzy and faint.  You have shortness of breath.  You have chest pain.  You have back pain that travels down to your legs and feet.  You feel your heart is beating fast or not regular, like it skips a beat.  You gain a lot of weight in a short period of time (5 pounds in 3-5 days).  You are involved in a domestic violence situation. Document Released: 08/28/2005 Document Revised: 09/02/2013 Document Reviewed: 02/19/2009 [facility] Patient Information 2015 [place], [place]. This information is not intended to replace advice given to you by your health care provider. Make sure you discuss any questions you have with your health care provider. Abdominal or Pelvic Ultrasound Ultrasound uses harmless sound waves instead of X-rays to take pictures of the inside of your body. A probe or wand device (transducer) is held up against your body to capture these pictures. The continually changing images can be recorded on videotape or film. Diagnostic ultrasound imaging is commonly called sonography or ultrasonography. There are different types of ultrasound exams. An ultrasound of the gallbladder, liver, and pancreas can show gallstones, masses, cysts, inflammation, infection, or enlarged organs. An ultrasound of the kidneys can show cysts, masses, kidney stones, and kidney size and shape. A pelvic ultrasound can show the uterus, ovaries, and cysts or masses. An obstetrical  ultrasound shows the position of the fetus, measurements for maturity, fetal heartbeat, and fetal organs. A breast, thyroid, or testicular ultrasound can show if a nodule is solid or cystic. RISKS AND COMPLICATIONS Ultrasound has been used for many years and has never shown any harmful effects. Studies in humans have shown no direct link between the use of ultrasound and adverse outcomes. BEFORE THE PROCEDURE Other than drinking water, do not eat or drink for 8 to 12 hours before the test or as directed by your caregiver. Follow any other diet instructions from your caregiver. If you are having a pelvic ultrasound, you may need to drink a lot of liquid before the exam. A full bladder helps to see the organs behind the bladder better. PROCEDURE  There is no pain in an ultrasound exam. A gel is applied to your skin, and the transducer is then placed on the area to be examined. The gel may feel cool. The gel wipes off easily, but it is a good idea to wear clothing that is easily washable. The images from inside your body are displayed on one or more monitors that look like small television screens. The returning sound waves produce pictures of the organs that were in the path of the sound sent from the transducer. The room is usually darkened during the exam. This makes it easier  to see the images on the monitor. The ultrasound exam should take less than 1 hour. AFTER THE PROCEDURE You can safely drive home and return to regular activities immediately after the exam. Ask when your test results will be ready. Make sure you get your test results. Document Released: 08/25/2000 Document Revised: 11/20/2011 Document Reviewed: 02/16/2011 [facility] Patient Information 2015 [place], [place]. This information is not intended to replace advice given to you by your health care provider. Make sure you discuss any questions you have with your health care provider.

## 2014-03-27 ENCOUNTER — Other Ambulatory Visit: Payer: BC Managed Care – PPO

## 2014-03-27 DIAGNOSIS — Z3490 Encounter for supervision of normal pregnancy, unspecified, unspecified trimester: Secondary | ICD-10-CM

## 2014-03-27 LAB — HCG, QUANTITATIVE, PREGNANCY: HCG, BETA CHAIN, QUANT, S: 621.8 m[IU]/mL

## 2014-04-02 ENCOUNTER — Other Ambulatory Visit: Payer: Self-pay | Admitting: Gynecology

## 2014-04-02 ENCOUNTER — Ambulatory Visit (INDEPENDENT_AMBULATORY_CARE_PROVIDER_SITE_OTHER): Payer: BC Managed Care – PPO | Admitting: Gynecology

## 2014-04-02 ENCOUNTER — Encounter: Payer: Self-pay | Admitting: Gynecology

## 2014-04-02 ENCOUNTER — Ambulatory Visit (INDEPENDENT_AMBULATORY_CARE_PROVIDER_SITE_OTHER): Payer: BC Managed Care – PPO

## 2014-04-02 DIAGNOSIS — N912 Amenorrhea, unspecified: Secondary | ICD-10-CM

## 2014-04-02 DIAGNOSIS — Z3490 Encounter for supervision of normal pregnancy, unspecified, unspecified trimester: Secondary | ICD-10-CM

## 2014-04-02 DIAGNOSIS — O9989 Other specified diseases and conditions complicating pregnancy, childbirth and the puerperium: Secondary | ICD-10-CM

## 2014-04-02 DIAGNOSIS — Z331 Pregnant state, incidental: Secondary | ICD-10-CM

## 2014-04-02 LAB — US OB TRANSVAGINAL

## 2014-04-02 NOTE — Progress Notes (Signed)
   Patient was seen in the office July 14 stating that she not had a menstrual cycle since June of this year. Prior to that she was having normal menstrual cycles. She had been using condoms for contraception. Her urine pregnancy test was positive and she had quantitative beta-hCGs here in the office shortly thereafter with the following results:  Results for CHARLENE, DETTER (MRN [id number]) as of 04/02/2014 16:03  Ref. Range 03/24/2014 09:34 03/27/2014 09:28  hCG, Beta Chain, Quant, S No range found 127.2 621.8    In 2010 she had a laparoscopy with endometriosis discovered. She has been asymptomatic since then.  Patient is asymptomatic. No vaginal bleeding reported. No nausea or vomiting. Ultrasound today: Intrauterine pregnancy seen located at the fundus size less than dates. By last menstrual period patient currently 7 weeks and 2 days and by ultrasound 5 weeks and 3 days. Gestational sac was seen normal-shaped yolk sac. Cervix is long closed a right corpus luteum cyst measuring 21 x 26 mm was noted. Left ovary normal no apparent adnexal masses.  Assessment/plan: First trimester pregnancy with normal rise in quantitative beta-hCG. Prior history of laparoscopy for endometriosis. Patient with gestational sac noted consistent with 5 weeks and 3 days. Patient will return back to the office in 3 weeks for an ultrasound for fetal viability. She will continue her prenatal vitamins.

## 2014-04-30 ENCOUNTER — Other Ambulatory Visit: Payer: BC Managed Care – PPO

## 2014-04-30 ENCOUNTER — Ambulatory Visit: Payer: BC Managed Care – PPO | Admitting: Gynecology

## 2014-04-30 ENCOUNTER — Ambulatory Visit (INDEPENDENT_AMBULATORY_CARE_PROVIDER_SITE_OTHER): Payer: BC Managed Care – PPO

## 2014-04-30 ENCOUNTER — Ambulatory Visit (INDEPENDENT_AMBULATORY_CARE_PROVIDER_SITE_OTHER): Payer: BC Managed Care – PPO | Admitting: Gynecology

## 2014-04-30 ENCOUNTER — Other Ambulatory Visit: Payer: Self-pay | Admitting: Gynecology

## 2014-04-30 ENCOUNTER — Encounter: Payer: Self-pay | Admitting: Gynecology

## 2014-04-30 DIAGNOSIS — N912 Amenorrhea, unspecified: Secondary | ICD-10-CM

## 2014-04-30 DIAGNOSIS — O34519 Maternal care for incarceration of gravid uterus, unspecified trimester: Secondary | ICD-10-CM

## 2014-04-30 DIAGNOSIS — O34531 Maternal care for retroversion of gravid uterus, first trimester: Secondary | ICD-10-CM

## 2014-04-30 DIAGNOSIS — N831 Corpus luteum cyst of ovary, unspecified side: Secondary | ICD-10-CM

## 2014-04-30 DIAGNOSIS — Z331 Pregnant state, incidental: Secondary | ICD-10-CM

## 2014-04-30 DIAGNOSIS — D259 Leiomyoma of uterus, unspecified: Secondary | ICD-10-CM

## 2014-04-30 DIAGNOSIS — Z3490 Encounter for supervision of normal pregnancy, unspecified, unspecified trimester: Secondary | ICD-10-CM

## 2014-04-30 DIAGNOSIS — O200 Threatened abortion: Secondary | ICD-10-CM

## 2014-04-30 DIAGNOSIS — O3410 Maternal care for benign tumor of corpus uteri, unspecified trimester: Secondary | ICD-10-CM

## 2014-04-30 DIAGNOSIS — O34539 Maternal care for retroversion of gravid uterus, unspecified trimester: Secondary | ICD-10-CM

## 2014-04-30 LAB — US OB TRANSVAGINAL

## 2014-04-30 NOTE — Progress Notes (Signed)
   Patient presented to the office today for first trimester ultrasound. The patient had previously been seen in the office on 04/02/2014. Shallow positive pregnancy test and her quantitative beta hCG were as follows  03/24/2014 127 03/28/1999 5621.8  Patient denies any vaginal bleeding, nausea, or vomiting. Patient's currently on her prenatal vitamins. Ultrasound today demonstrated a viable intrauterine pregnancy with fetal pole seen. Crown-rump length measurement consistent with 9 weeks size less than dates. Fetal heart movement was noted that 169 beats per minute. A small cervical hematoma was seen measuring 11 x 9 mm. A small right corpus luteum cyst was noted left ovary was normal. A small subserosal fibroid measuring 3.6 x 3.0 cm was noted. No fluid in the cul-de-sac.  Assessment/plan: First trimester pregnancy size less than dates fetal viability noted. Ultrasound dated 9 weeks with estimated date of confinement 12/03/2014. By last menstrual period the patient would have been 11 weeks and 2 days with a due date of 11/17/2014. Patient will be referred to my obstetrical colleagues for her prenatal care and delivery.

## 2014-05-22 ENCOUNTER — Other Ambulatory Visit: Payer: Self-pay

## 2014-05-22 LAB — OB RESULTS CONSOLE GC/CHLAMYDIA
CHLAMYDIA, DNA PROBE: NEGATIVE
Gonorrhea: NEGATIVE

## 2014-05-22 LAB — OB RESULTS CONSOLE ABO/RH: RH Type: POSITIVE

## 2014-05-22 LAB — OB RESULTS CONSOLE RUBELLA ANTIBODY, IGM: Rubella: IMMUNE

## 2014-05-22 LAB — OB RESULTS CONSOLE ANTIBODY SCREEN: Antibody Screen: NEGATIVE

## 2014-05-22 LAB — OB RESULTS CONSOLE HIV ANTIBODY (ROUTINE TESTING): HIV: NONREACTIVE

## 2014-05-22 LAB — OB RESULTS CONSOLE HEPATITIS B SURFACE ANTIGEN: HEP B S AG: NEGATIVE

## 2014-05-22 LAB — OB RESULTS CONSOLE RPR: RPR: NONREACTIVE

## 2014-07-13 ENCOUNTER — Encounter: Payer: Self-pay | Admitting: Gynecology

## 2014-08-18 ENCOUNTER — Inpatient Hospital Stay (HOSPITAL_COMMUNITY)
Admission: AD | Admit: 2014-08-18 | Discharge: 2014-08-18 | Disposition: A | Discharge status: Home / Self Care | Payer: BC Managed Care – PPO | Source: Ambulatory Visit | Attending: Obstetrics & Gynecology | Admitting: Obstetrics & Gynecology

## 2014-08-18 ENCOUNTER — Encounter (HOSPITAL_COMMUNITY): Payer: Self-pay | Admitting: *Deleted

## 2014-08-18 DIAGNOSIS — O26899 Other specified pregnancy related conditions, unspecified trimester: Secondary | ICD-10-CM

## 2014-08-18 DIAGNOSIS — O99612 Diseases of the digestive system complicating pregnancy, second trimester: Secondary | ICD-10-CM | POA: Insufficient documentation

## 2014-08-18 DIAGNOSIS — K219 Gastro-esophageal reflux disease without esophagitis: Secondary | ICD-10-CM | POA: Diagnosis not present

## 2014-08-18 DIAGNOSIS — Z3A24 24 weeks gestation of pregnancy: Secondary | ICD-10-CM | POA: Insufficient documentation

## 2014-08-18 DIAGNOSIS — O9989 Other specified diseases and conditions complicating pregnancy, childbirth and the puerperium: Secondary | ICD-10-CM | POA: Insufficient documentation

## 2014-08-18 DIAGNOSIS — R109 Unspecified abdominal pain: Secondary | ICD-10-CM | POA: Insufficient documentation

## 2014-08-18 LAB — URINALYSIS, ROUTINE W REFLEX MICROSCOPIC
Bilirubin Urine: NEGATIVE
GLUCOSE, UA: NEGATIVE mg/dL
Ketones, ur: NEGATIVE mg/dL
Nitrite: NEGATIVE
Protein, ur: NEGATIVE mg/dL
Specific Gravity, Urine: 1.015 (ref 1.005–1.030)
Urobilinogen, UA: 0.2 mg/dL (ref 0.0–1.0)
pH: 6 (ref 5.0–8.0)

## 2014-08-18 LAB — URINE MICROSCOPIC-ADD ON

## 2014-08-18 MED ORDER — ACETAMINOPHEN 500 MG PO TABS
1000.0000 mg | ORAL_TABLET | Freq: Once | ORAL | Status: AC
  Administered 2014-08-18: 1000 mg via ORAL
  Filled 2014-08-18: qty 2

## 2014-08-18 NOTE — MAU Note (Signed)
abd cramping since last night. Denies bleeding or leaking.  No hx of PTL.  Denies GI and GU problems.

## 2014-08-18 NOTE — MAU Provider Note (Signed)
History     CSN: [id number]  Arrival date and time: 08/18/14 1702   First Provider Initiated Contact with Patient 08/18/14 1818      Chief Complaint  Patient presents with  . Abdominal Cramping   HPI Pt is [redacted]w[redacted]d G1P0 c/o bilateral constant pain- starts at top of uterus and radiated bilaterally down each side- does not extend to pelvis. Pt denies spotting or bleeding- denies pain with urination, denies contstipation or diarrhea, nausea or vomiting. Pt has GERD and takes Tums or Rolaids or drinks milk- not having at this time Pain at present 7/10 was 10/10 earlier today and brought to tears- has not taken anything for the pain Pain is worse with sitting- no pain with walking- hurts more with cough or sneeze  Past Medical History  Diagnosis Date  . CIN I (cervical intraepithelial neoplasia I)   . Migraines   . Eczema   . Endometriosis     Past Surgical History  Procedure Laterality Date  . Pelvic laparoscopy  09/28/08    DIAGNOSTIC LAPAROSCOPY  . Colposcopy      Family History  Problem Relation Age of Onset  . Hypertension Mother   . Diabetes Father   . Hypertension Father   . Cancer Maternal Grandmother     STOMACH TUMOR    History  Substance Use Topics  . Smoking status: Never Smoker   . Smokeless tobacco: Not on file  . Alcohol Use: Yes     Comment: Rare    Allergies:  Allergies  Allergen Reactions  . Latex Rash    Prescriptions prior to admission  Medication Sig Dispense Refill Last Dose  . Prenatal Vit-Fe Fumarate-FA (PRENATAL MULTIVITAMIN) TABS tablet Take 1 tablet by mouth daily at 12 noon.   08/18/2014 at Unknown time  . Mefenamic Acid 250 MG CAPS Take 1 capsule (250 mg total) by mouth 4 (four) times daily as needed. (Patient not taking: Reported on 08/18/2014) 28 each 10 Taking    Review of Systems  Constitutional: Negative for fever and chills.  Gastrointestinal: Positive for heartburn and abdominal pain. Negative for nausea, vomiting, diarrhea  and constipation.  Genitourinary: Negative for dysuria and urgency.  Neurological: Negative for headaches.   Physical Exam   Blood pressure 121/71, pulse 106, temperature 98.2 F (36.8 C), temperature source Oral, resp. rate 16, last menstrual period 02/10/2014.  Physical Exam  Nursing note and vitals reviewed. Constitutional: She is oriented to person, place, and time. She appears well-developed and well-nourished. No distress.  HENT:  Head: Normocephalic.  Eyes: Pupils are equal, round, and reactive to light.  Neck: Normal range of motion. Neck supple.  Cardiovascular: Normal rate.   Respiratory: Effort normal.  GI: Soft.  Musculoskeletal: Normal range of motion.  Neurological: She is alert and oriented to person, place, and time.  Skin: Skin is warm and dry.  Psychiatric: She has a normal mood and affect.    MAU Course  Procedures Results for orders placed or performed during the hospital encounter of 08/18/14 (from the past 24 hour(s))  Urinalysis, Routine w reflex microscopic     Status: Abnormal   Collection Time: 08/18/14  5:10 PM  Result Value Ref Range   Color, Urine YELLOW YELLOW   APPearance CLEAR CLEAR   Specific Gravity, Urine 1.015 1.005 - 1.030   pH 6.0 5.0 - 8.0   Glucose, UA NEGATIVE NEGATIVE mg/dL   Hgb urine dipstick TRACE (A) NEGATIVE   Bilirubin Urine NEGATIVE NEGATIVE   Ketones, ur  NEGATIVE NEGATIVE mg/dL   Protein, ur NEGATIVE NEGATIVE mg/dL   Urobilinogen, UA 0.2 0.0 - 1.0 mg/dL   Nitrite NEGATIVE NEGATIVE   Leukocytes, UA TRACE (A) NEGATIVE  Urine microscopic-add on     Status: None   Collection Time: 08/18/14  5:10 PM  Result Value Ref Range   Squamous Epithelial / LPF RARE RARE   WBC, UA 0-2 <3 WBC/hpf   RBC / HPF 0-2 <3 RBC/hpf   Bacteria, UA RARE RARE  Tylenol 1000mg  Discussed with Dr. Alwyn Pea FHR 145 bpm with reassuring pattern- 10x10 accelerations- no decelerations- no ctx noted Assessment and Plan  Abd pain in pregnancy- suspect round  ligament pain Tylenol and PO hydrate F/u in office as scheduled- sooner if increase in pain  Dyer Klug 08/18/2014, 6:18 PM

## 2014-09-11 NOTE — L&D Delivery Note (Signed)
Delivery Note Patient pushed well for 25 minutes without epidural.  At 5:57 PM a viable female was delivered via Vaginal, Spontaneous Delivery (Presentation: Right Occiput Anterior).  APGAR: 9, 10; weight 6 lb 5.4 oz (2875 g).   Placenta status: Intact, Spontaneous.  Cord: 3 vessels with the following complications: None.  Cord pH: n/a  Anesthesia: Local  Episiotomy: None Lacerations: bilateral labial lacerations Suture Repair: 3.0 Est. Blood Loss (mL): 250  Mom to postpartum.  Baby to Couplet care / Skin to Skin.  [place] 11/27/2014, 7:57 PM

## 2014-10-08 ENCOUNTER — Ambulatory Visit (INDEPENDENT_AMBULATORY_CARE_PROVIDER_SITE_OTHER): Payer: Self-pay | Admitting: Pediatrics

## 2014-10-08 DIAGNOSIS — Z7681 Expectant parent(s) prebirth pediatrician visit: Secondary | ICD-10-CM

## 2014-10-08 NOTE — Progress Notes (Signed)
Prenatal visit of Parents with Pediatrician: 34 yo AAF with partner, 8 months pregnant, expecting first child (girl) Described basics of practice to parents; hours, providers, after hours access Discussed vaccination policy and schedule Answered parents specific questions

## 2014-11-04 ENCOUNTER — Other Ambulatory Visit: Payer: Self-pay | Admitting: Obstetrics and Gynecology

## 2014-11-04 LAB — OB RESULTS CONSOLE GBS: GBS: POSITIVE

## 2014-11-27 ENCOUNTER — Inpatient Hospital Stay (HOSPITAL_COMMUNITY)
Admission: AD | Admit: 2014-11-27 | Discharge: 2014-11-29 | DRG: 775 | Disposition: A | Discharge status: Home / Self Care | Payer: Medicaid Other | Source: Ambulatory Visit | Attending: Obstetrics | Admitting: Obstetrics

## 2014-11-27 ENCOUNTER — Encounter (HOSPITAL_COMMUNITY): Payer: Self-pay | Admitting: *Deleted

## 2014-11-27 DIAGNOSIS — O133 Gestational [pregnancy-induced] hypertension without significant proteinuria, third trimester: Principal | ICD-10-CM | POA: Diagnosis present

## 2014-11-27 DIAGNOSIS — Z3A39 39 weeks gestation of pregnancy: Secondary | ICD-10-CM | POA: Diagnosis present

## 2014-11-27 DIAGNOSIS — O99824 Streptococcus B carrier state complicating childbirth: Secondary | ICD-10-CM | POA: Diagnosis present

## 2014-11-27 DIAGNOSIS — O139 Gestational [pregnancy-induced] hypertension without significant proteinuria, unspecified trimester: Secondary | ICD-10-CM | POA: Diagnosis present

## 2014-11-27 DIAGNOSIS — Z87410 Personal history of cervical dysplasia: Secondary | ICD-10-CM | POA: Diagnosis not present

## 2014-11-27 HISTORY — DX: Partial loss of teeth, unspecified cause, unspecified class: K08.409

## 2014-11-27 HISTORY — DX: Unspecified abnormal cytological findings in specimens from vagina: R87.629

## 2014-11-27 HISTORY — DX: Other specified postprocedural states: Z98.890

## 2014-11-27 HISTORY — DX: Personal history of other diseases of the female genital tract: Z87.42

## 2014-11-27 LAB — CBC
HEMATOCRIT: 37.5 % (ref 36.0–46.0)
Hemoglobin: 12.5 g/dL (ref 12.0–15.0)
MCH: 27.5 pg (ref 26.0–34.0)
MCHC: 33.3 g/dL (ref 30.0–36.0)
MCV: 82.6 fL (ref 78.0–100.0)
Platelets: 134 10*3/uL — ABNORMAL LOW (ref 150–400)
RBC: 4.54 MIL/uL (ref 3.87–5.11)
RDW: 15 % (ref 11.5–15.5)
WBC: 7 10*3/uL (ref 4.0–10.5)

## 2014-11-27 LAB — TYPE AND SCREEN
ABO/RH(D): O POS
ANTIBODY SCREEN: NEGATIVE

## 2014-11-27 LAB — COMPREHENSIVE METABOLIC PANEL
ALT: 28 U/L (ref 0–35)
AST: 27 U/L (ref 0–37)
Albumin: 3.3 g/dL — ABNORMAL LOW (ref 3.5–5.2)
Alkaline Phosphatase: 267 U/L — ABNORMAL HIGH (ref 39–117)
Anion gap: 7 (ref 5–15)
BUN: 8 mg/dL (ref 6–23)
CALCIUM: 10 mg/dL (ref 8.4–10.5)
CO2: 24 mmol/L (ref 19–32)
CREATININE: 0.77 mg/dL (ref 0.50–1.10)
Chloride: 106 mmol/L (ref 96–112)
GFR calc Af Amer: >90 mL/min (ref >90)
Glucose, Bld: 93 mg/dL (ref 70–99)
Potassium: 3.9 mmol/L (ref 3.5–5.1)
Sodium: 137 mmol/L (ref 135–145)
Total Bilirubin: 0.4 mg/dL (ref 0.3–1.2)
Total Protein: 6.3 g/dL (ref 6.0–8.3)

## 2014-11-27 LAB — ABO/RH: ABO/RH(D): O POS

## 2014-11-27 LAB — URIC ACID: Uric Acid, Serum: 6.2 mg/dL (ref 2.4–7.0)

## 2014-11-27 LAB — RPR: RPR Ser Ql: NONREACTIVE

## 2014-11-27 MED ORDER — CITRIC ACID-SODIUM CITRATE 334-500 MG/5ML PO SOLN: 30.0000 mL | ORAL | Status: DC | PRN

## 2014-11-27 MED ORDER — WITCH HAZEL-GLYCERIN EX PADS: 1.0000 "application " | MEDICATED_PAD | CUTANEOUS | Status: DC | PRN

## 2014-11-27 MED ORDER — OXYTOCIN BOLUS FROM INFUSION
500.0000 mL | INTRAVENOUS | Status: DC
  Administered 2014-11-27: 500 mL via INTRAVENOUS

## 2014-11-27 MED ORDER — PRENATAL MULTIVITAMIN CH
1.0000 | ORAL_TABLET | Freq: Every day | ORAL | Status: DC
  Administered 2014-11-28 – 2014-11-29 (×2): 1 via ORAL
  Filled 2014-11-27 (×2): qty 1

## 2014-11-27 MED ORDER — PHENYLEPHRINE 40 MCG/ML (10ML) SYRINGE FOR IV PUSH (FOR BLOOD PRESSURE SUPPORT): 80.0000 ug | PREFILLED_SYRINGE | INTRAVENOUS | Status: DC | PRN

## 2014-11-27 MED ORDER — PENICILLIN G POTASSIUM 5000000 UNITS IJ SOLR: 2.5000 10*6.[IU] | INTRAMUSCULAR | Status: DC

## 2014-11-27 MED ORDER — OXYCODONE-ACETAMINOPHEN 5-325 MG PO TABS: 2.0000 | ORAL_TABLET | ORAL | Status: DC | PRN

## 2014-11-27 MED ORDER — SENNOSIDES-DOCUSATE SODIUM 8.6-50 MG PO TABS
2.0000 | ORAL_TABLET | ORAL | Status: DC
Start: 2014-11-28 — End: 2014-11-29
  Administered 2014-11-27 – 2014-11-28 (×2): 2 via ORAL
  Filled 2014-11-27 (×2): qty 2

## 2014-11-27 MED ORDER — SIMETHICONE 80 MG PO CHEW: 80.0000 mg | CHEWABLE_TABLET | ORAL | Status: DC | PRN

## 2014-11-27 MED ORDER — LACTATED RINGERS IV SOLN: 500.0000 mL | INTRAVENOUS | Status: DC | PRN

## 2014-11-27 MED ORDER — TERBUTALINE SULFATE 1 MG/ML IJ SOLN: 0.2500 mg | Freq: Once | INTRAMUSCULAR | Status: DC | PRN

## 2014-11-27 MED ORDER — IBUPROFEN 600 MG PO TABS
600.0000 mg | ORAL_TABLET | Freq: Four times a day (QID) | ORAL | Status: DC
  Administered 2014-11-27 – 2014-11-29 (×8): 600 mg via ORAL
  Filled 2014-11-27 (×8): qty 1

## 2014-11-27 MED ORDER — OXYCODONE-ACETAMINOPHEN 5-325 MG PO TABS: 1.0000 | ORAL_TABLET | ORAL | Status: DC | PRN

## 2014-11-27 MED ORDER — ACETAMINOPHEN 325 MG PO TABS: 650.0000 mg | ORAL_TABLET | ORAL | Status: DC | PRN

## 2014-11-27 MED ORDER — LANOLIN HYDROUS EX OINT: TOPICAL_OINTMENT | CUTANEOUS | Status: DC | PRN

## 2014-11-27 MED ORDER — DIBUCAINE 1 % RE OINT
1.0000 "application " | TOPICAL_OINTMENT | RECTAL | Status: DC | PRN
  Filled 2014-11-27: qty 28

## 2014-11-27 MED ORDER — OXYTOCIN 40 UNITS IN LACTATED RINGERS INFUSION - SIMPLE MED
62.5000 mL/h | INTRAVENOUS | Status: DC
  Administered 2014-11-27: 62.5 mL/h via INTRAVENOUS

## 2014-11-27 MED ORDER — BENZOCAINE-MENTHOL 20-0.5 % EX AERO
1.0000 "application " | INHALATION_SPRAY | CUTANEOUS | Status: DC | PRN
  Filled 2014-11-27: qty 56

## 2014-11-27 MED ORDER — LACTATED RINGERS IV SOLN: 500.0000 mL | Freq: Once | INTRAVENOUS | Status: DC

## 2014-11-27 MED ORDER — PENICILLIN G POTASSIUM 5000000 UNITS IJ SOLR: 5.0000 10*6.[IU] | Freq: Once | INTRAVENOUS | Status: DC

## 2014-11-27 MED ORDER — OXYTOCIN 40 UNITS IN LACTATED RINGERS INFUSION - SIMPLE MED
1.0000 m[IU]/min | INTRAVENOUS | Status: DC
  Administered 2014-11-27: 2 m[IU]/min via INTRAVENOUS
  Filled 2014-11-27: qty 1000

## 2014-11-27 MED ORDER — BUTORPHANOL TARTRATE 1 MG/ML IJ SOLN
1.0000 mg | INTRAMUSCULAR | Status: DC | PRN
  Administered 2014-11-27 (×2): 1 mg via INTRAVENOUS
  Filled 2014-11-27 (×2): qty 1

## 2014-11-27 MED ORDER — ONDANSETRON HCL 4 MG/2ML IJ SOLN: 4.0000 mg | Freq: Four times a day (QID) | INTRAMUSCULAR | Status: DC | PRN

## 2014-11-27 MED ORDER — EPHEDRINE 5 MG/ML INJ: 10.0000 mg | INTRAVENOUS | Status: DC | PRN

## 2014-11-27 MED ORDER — ONDANSETRON HCL 4 MG PO TABS: 4.0000 mg | ORAL_TABLET | ORAL | Status: DC | PRN

## 2014-11-27 MED ORDER — LACTATED RINGERS IV SOLN
INTRAVENOUS | Status: DC
  Administered 2014-11-27 (×2): via INTRAVENOUS

## 2014-11-27 MED ORDER — DIPHENHYDRAMINE HCL 50 MG/ML IJ SOLN: 12.5000 mg | INTRAMUSCULAR | Status: DC | PRN

## 2014-11-27 MED ORDER — PENICILLIN G POTASSIUM 5000000 UNITS IJ SOLR
5.0000 10*6.[IU] | Freq: Once | INTRAVENOUS | Status: AC
  Administered 2014-11-27: 5 10*6.[IU] via INTRAVENOUS
  Filled 2014-11-27: qty 5

## 2014-11-27 MED ORDER — ONDANSETRON HCL 4 MG/2ML IJ SOLN
4.0000 mg | INTRAMUSCULAR | Status: DC | PRN
Start: 2014-11-27 — End: 2014-11-29

## 2014-11-27 MED ORDER — DIPHENHYDRAMINE HCL 25 MG PO CAPS: 25.0000 mg | ORAL_CAPSULE | Freq: Four times a day (QID) | ORAL | Status: DC | PRN

## 2014-11-27 MED ORDER — PENICILLIN G POTASSIUM 5000000 UNITS IJ SOLR
2.5000 10*6.[IU] | INTRAVENOUS | Status: DC
  Administered 2014-11-27 (×3): 2.5 10*6.[IU] via INTRAVENOUS
  Filled 2014-11-27 (×5): qty 2.5

## 2014-11-27 MED ORDER — LIDOCAINE HCL (PF) 1 % IJ SOLN
30.0000 mL | INTRAMUSCULAR | Status: DC | PRN
  Administered 2014-11-27: 30 mL via SUBCUTANEOUS
  Filled 2014-11-27: qty 30

## 2014-11-27 MED ORDER — FENTANYL 2.5 MCG/ML BUPIVACAINE 1/10 % EPIDURAL INFUSION (WH - ANES): 14.0000 mL/h | INTRAMUSCULAR | Status: DC | PRN

## 2014-11-27 MED ORDER — FLEET ENEMA 7-19 GM/118ML RE ENEM: 1.0000 | ENEMA | RECTAL | Status: DC | PRN

## 2014-11-27 NOTE — Progress Notes (Signed)
On birthing ball, breathing through ctx  BP 131/93 mmHg  Pulse 88  Temp(Src) 98.7 F (37.1 C) (Oral)  Resp 18  Ht 5\' 1"  (1.549 m)  Wt 80.74 kg (178 lb)  BMI 33.65 kg/m2  LMP 02/10/2014  Toco: q3-4 min, pitocin at 12 EFM: 135 mod var + accels, cat 1 SVE: 2-3/70/-1, AROM clear fluid  Cont IOL for GHTN at [redacted]w[redacted]d

## 2014-11-27 NOTE — MAU Note (Signed)
PT SAYS   HURTING  SINCE  2 PM  VE IN  OFFICE  1 CM  ON Tuesday.  DENIES  HSV AND MRSA.  GBS- POSITIVE.

## 2014-11-27 NOTE — Progress Notes (Signed)
Assisting with patient care at this time. Pt doing well. Noticed elevated BP, Dr. Carlis Abbott at bedside and is aware of BP and blood work at this time. Pt denies any HA, Blurred Vision or Epigastric Pain, or scatoma.

## 2014-11-27 NOTE — H&P (Signed)
34 y.o. G1P0 @ [redacted]w[redacted]d presents with complaints of contractions.  She was noted to be in early labor, however BPs were elevated at 140-150/100s.  She denies HA/BV/RUQ pain.  Otherwise has good fetal movement and no bleeding.  Past Medical History  Diagnosis Date  . CIN I (cervical intraepithelial neoplasia I)   . Migraines   . Eczema   . Endometriosis   . Vaginal Pap smear, abnormal     Past Surgical History  Procedure Laterality Date  . Pelvic laparoscopy  09/28/08    DIAGNOSTIC LAPAROSCOPY  . Colposcopy    . Wisdom tooth extraction      OB History  Gravida Para Term Preterm AB SAB TAB Ectopic Multiple Living  1             # Outcome Date GA Lbr Len/2nd Weight Sex Delivery Anes PTL Lv  1 Current               History   Social History  . Marital Status: Single    Spouse Name: N/A  . Number of Children: N/A  . Years of Education: N/A   Occupational History  . Not on file.   Social History Main Topics  . Smoking status: Never Smoker   . Smokeless tobacco: Not on file  . Alcohol Use: Yes     Comment: Rare; but not while pregnant  . Drug Use: No  . Sexual Activity: Yes    Birth Control/ Protection: Condom     Comment: last intercourse Nov 24 2014   Other Topics Concern  . Not on file   Social History Narrative   Latex    Prenatal Transfer Tool  Maternal Diabetes: No (failed 1hr, passed 3hr) Genetic Screening: Normal Maternal Ultrasounds/Referrals: Normal Fetal Ultrasounds or other Referrals:  None Maternal Substance Abuse:  No Significant Maternal Medications:  None Significant Maternal Lab Results: Lab values include: Group B Strep positive  ABO, Rh: --/--/O POS (03/18 0610) Antibody: NEG (03/18 0610) Rubella:  Immune RPR: Nonreactive (09/11 0000)  HBsAg: Negative (09/11 0000)  HIV: Non-reactive (09/11 0000)  GBS: Positive (02/24 0000)    Other PNC: uncomplicated.    Filed Vitals:   11/27/14 0800  BP: 145/127  Pulse: 89  Temp:   Resp: 18      General:  NAD Lungs: CTAB Cardiac: RRR Abdomen:  soft, gravid, EFW 7-7.5# Ex:  no edema, DTRs 2+, no clonus SVE:  1/80/0 FHTs:  130s, mod var, + accels, no decels, Cat 1 Toco:  q4-5   A/P   34 y.o. [redacted]w[redacted]d  G1P0 presents with labor augmentation for GHTN at term BPs mild range and no symptoms of preeclampsia.  Plt 134, all other preE labs wnl.  GHTN.  If develops sx of severe preeclampsia, will start magnesium Labor augmentation: on pitocin FSR/ vtx/ GBS positive--PCN  [place]

## 2014-11-27 NOTE — Plan of Care (Signed)
Problem: Consults Goal: Birthing Suites Patient Information Press F2 to bring up selections list Outcome: Completed/Met Date Met:  11/27/14  Pt 37-[redacted] weeks EGA     

## 2014-11-27 NOTE — Plan of Care (Signed)
Problem: Phase I Progression Outcomes Goal: Assess per MD/Nurse,Routine-VS,FHR,UC,Head to Toe assess Outcome: Progressing Pt has elevated BP with S/S of preeclampsia. MD aware. Educated pt regarding the s/s of preeclampsia and when to notify RN/MD if she has symptoms. Pt verbalizes understanding.

## 2014-11-27 NOTE — Progress Notes (Signed)
1030- BP noted. Will continue to monitor.

## 2014-11-27 NOTE — Progress Notes (Addendum)
Called Dr. Carlis Abbott regarding Bp of 159/98 and 153/96. No blurred vision, no headache, reflexes plus 1 and no clonus. No further orders given.

## 2014-11-28 LAB — CBC
HCT: 32.7 % — ABNORMAL LOW (ref 36.0–46.0)
Hemoglobin: 10.9 g/dL — ABNORMAL LOW (ref 12.0–15.0)
MCH: 27.4 pg (ref 26.0–34.0)
MCHC: 33.3 g/dL (ref 30.0–36.0)
MCV: 82.2 fL (ref 78.0–100.0)
PLATELETS: 134 10*3/uL — AB (ref 150–400)
RBC: 3.98 MIL/uL (ref 3.87–5.11)
RDW: 14.8 % (ref 11.5–15.5)
WBC: 12 10*3/uL — ABNORMAL HIGH (ref 4.0–10.5)

## 2014-11-28 NOTE — Progress Notes (Signed)
Patient is doing well.  She is ambulating, voiding, tolerating PO.  Pain control is good.  Lochia is appropriate  Filed Vitals:   11/27/14 2025 11/27/14 2131 11/28/14 0132 11/28/14 0605  BP: 133/88 142/89 123/71 105/61  Pulse:  111 104 94  Temp: 99 F (37.2 C) 99 F (37.2 C) 99 F (37.2 C) 98.1 F (36.7 C)  TempSrc: Oral Oral Oral Oral  Resp: 18 18 18 18   Height:      Weight:        NAD Fundus firm Ext: trace edema bilaterally  Lab Results  Component Value Date   WBC 12.0* 11/28/2014   HGB 10.9* 11/28/2014   HCT 32.7* 11/28/2014   MCV 82.2 11/28/2014   PLT 134* 11/28/2014    --/--/O POS, O POS (03/18 0610)/RImmune  A/P 34 y.o. G1P1001 PPD#1 s/p TSVD. Routine care.   Expect d/c tomorrow.    [place]

## 2014-11-28 NOTE — Lactation Note (Addendum)
This note was copied from the chart of Christie Calianne Larue. Lactation Consultation Note  Baby has been BF often.  She was in the crib sucking on a pacifier.  I reviewed feeding cues with mom and ask if she wanted to BF.  SHe reported no because the baby had just eaten.  I again reminded her to feed on cue,  I encouraged her to BF often and to call for a latch check every 8 hours.  She agreed that she would.  Aware of support group and outpatient services.   Patient Name: Christie Camacho XOVAN'V Date: 11/28/2014 Reason for consult: Initial assessment   Maternal Data Has patient been taught Hand Expression?: Yes (Verbally explained. Hands on not shown because mom was visiting with guests)  Feeding Feeding Type: Breast Fed  LATCH Score/Interventions                      Lactation Tools Discussed/Used     Consult Status Consult Status: Follow-up    Van Clines 11/28/2014, 2:48 PM

## 2014-11-29 NOTE — Progress Notes (Addendum)
Patient is eating, ambulating, voiding.  Pain control is good.  Filed Vitals:   11/28/14 0605 11/28/14 0921 11/28/14 1726 11/29/14 0612  BP: 105/61 128/82 131/85 124/81  Pulse: 94 90 96 81  Temp: 98.1 F (36.7 C) 99.1 F (37.3 C) 97.6 F (36.4 C) 98.2 F (36.8 C)  TempSrc: Oral Oral Oral Oral  Resp: 18 18 20 17   Height:      Weight:        Fundus firm Perineum without swelling.  Lab Results  Component Value Date   WBC 12.0* 11/28/2014   HGB 10.9* 11/28/2014   HCT 32.7* 11/28/2014   MCV 82.2 11/28/2014   PLT 134* 11/28/2014    --/--/O POS, O POS (03/18 0610)/RI  A/P Post partum day 2.  BPs stable post partum.  Routine care.  Expect d/c today.    Nessie Nong A

## 2014-11-29 NOTE — Discharge Summary (Signed)
Obstetric Discharge Summary Reason for Admission: induction of labor Prenatal Procedures: PIH Intrapartum Procedures: spontaneous vaginal delivery Postpartum Procedures: none Complications-Operative and Postpartum: labial laceration HEMOGLOBIN  Date Value Ref Range Status  11/28/2014 10.9* 12.0 - 15.0 g/dL Final   HCT  Date Value Ref Range Status  11/28/2014 32.7* 36.0 - 46.0 % Final    Discharge Diagnoses: Term Pregnancy-delivered  Discharge Information: Date: 11/29/2014 Activity: pelvic rest Diet: routine Medications: Ibuprofen Condition: stable Instructions: refer to practice specific booklet Discharge to: home Follow-up Information    Follow up with [name], MD In 4 weeks.   Specialty:  Obstetrics and Gynecology   Contact information:   [place] [place] [phone]       Newborn Data: Live born female  Birth Weight: 6 lb 5.4 oz (2875 g) APGAR: 28, 10  Home with mother.  Christie Camacho A 11/29/2014, 9:14 AM

## 2014-12-01 NOTE — Progress Notes (Signed)
Post discharge chart review completed.  

## 2015-07-13 IMAGING — CR DG FOOT COMPLETE 3+V*R*
3 series · 3 of 3 positions shown · non-contrast
Comparison: 03/06/2012

CLINICAL DATA: Fourth metatarsal pain 3 days.

EXAM:
RIGHT FOOT COMPLETE - 3+ VIEW

[view not recorded (1 of 3)]
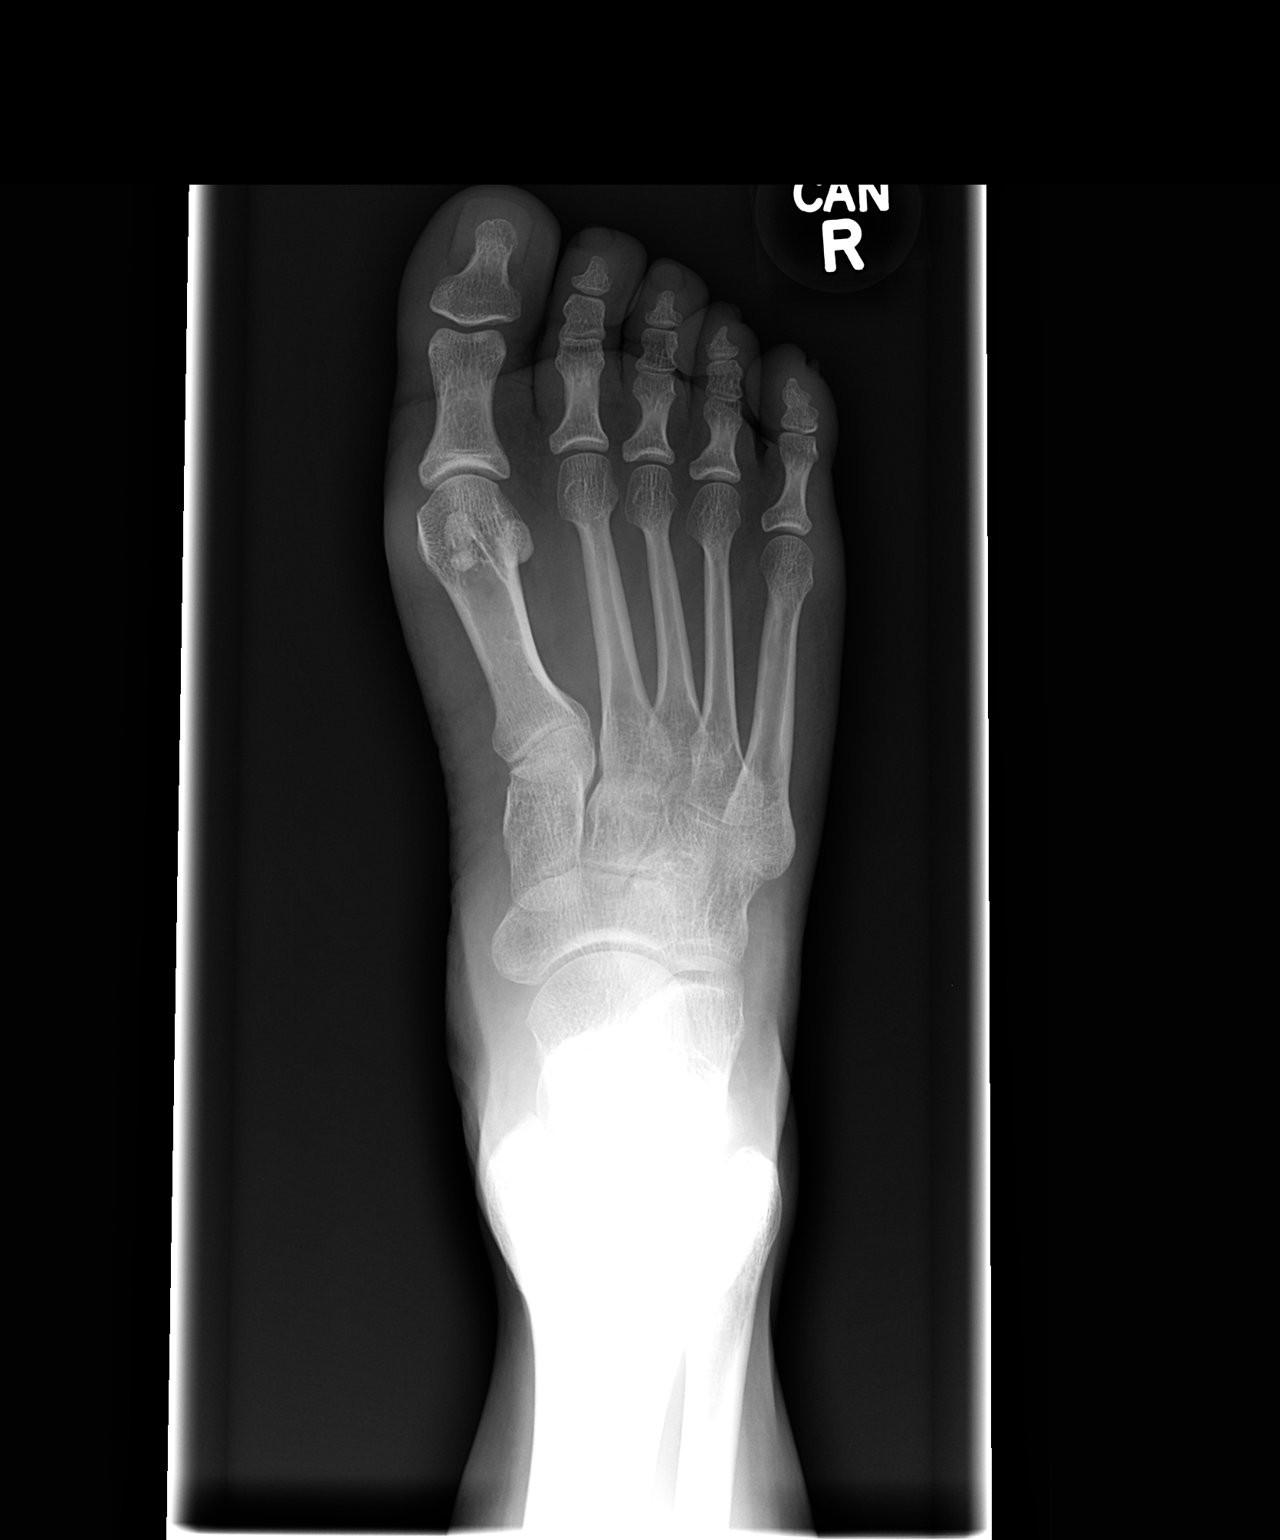

[view not recorded (2 of 3)]
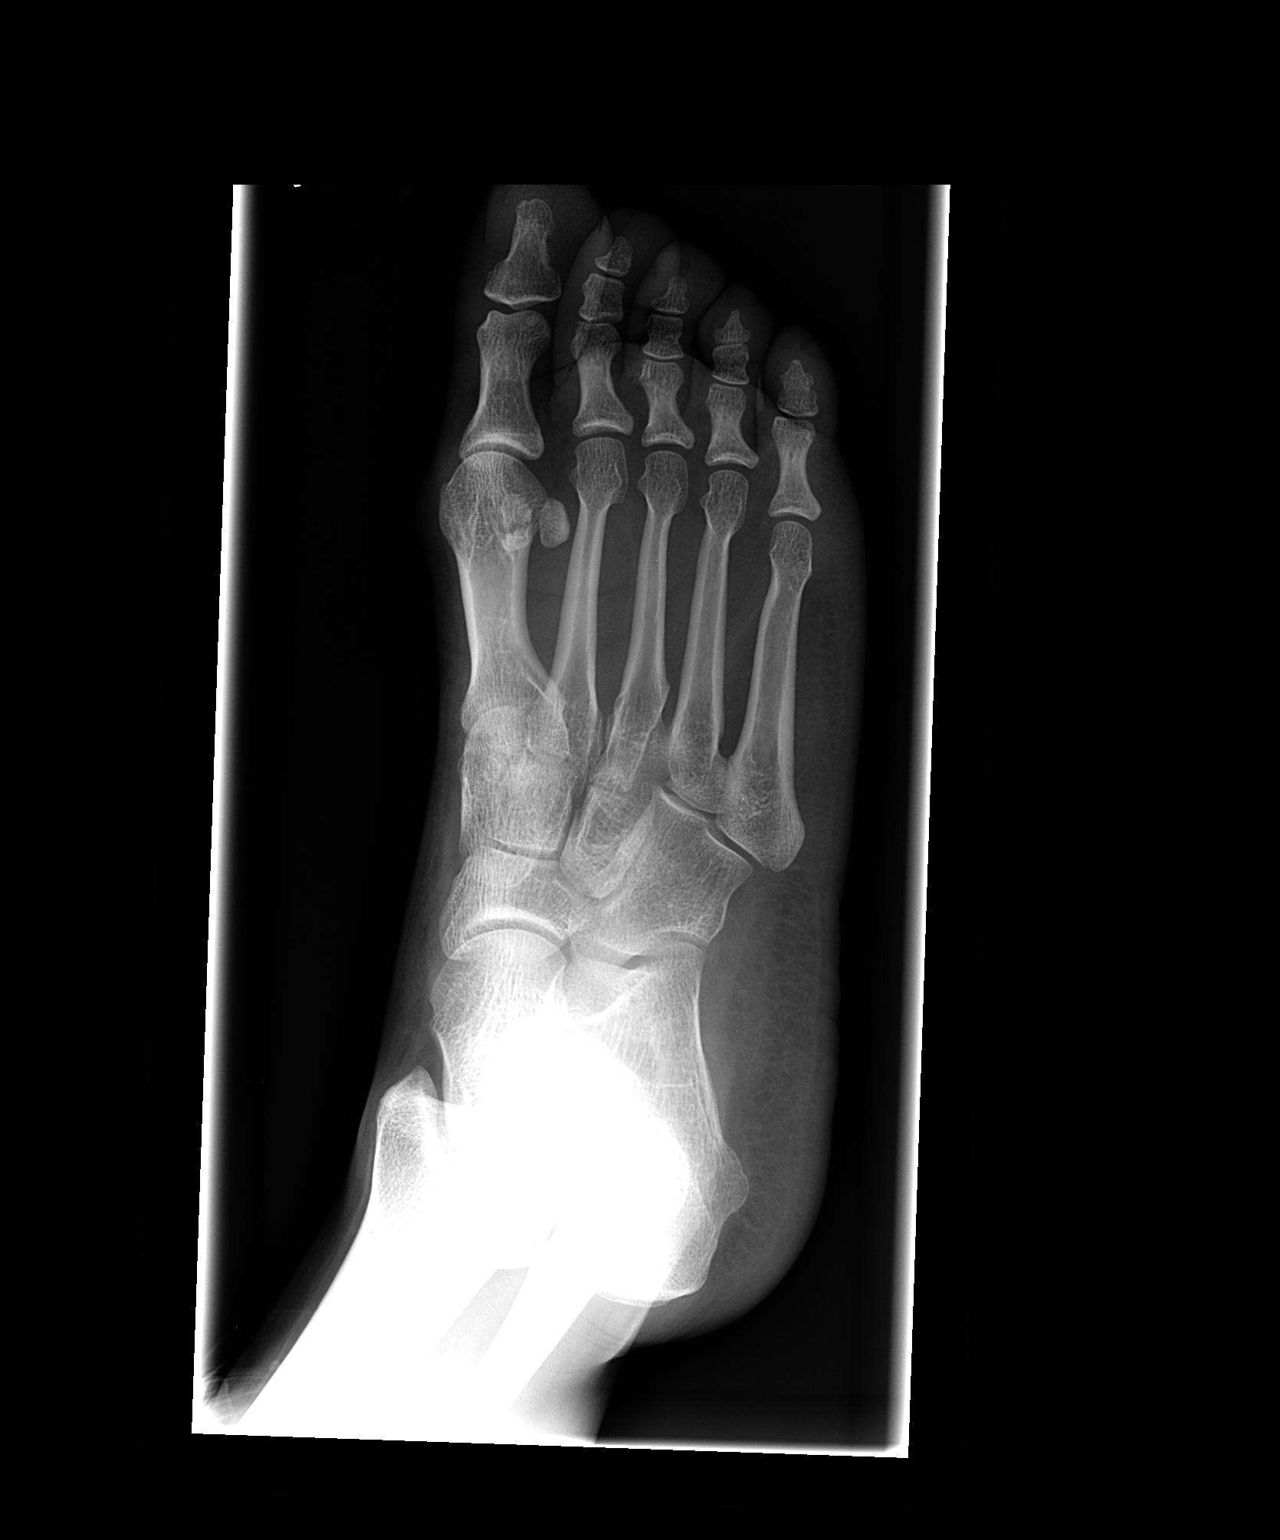

[view not recorded (3 of 3)]
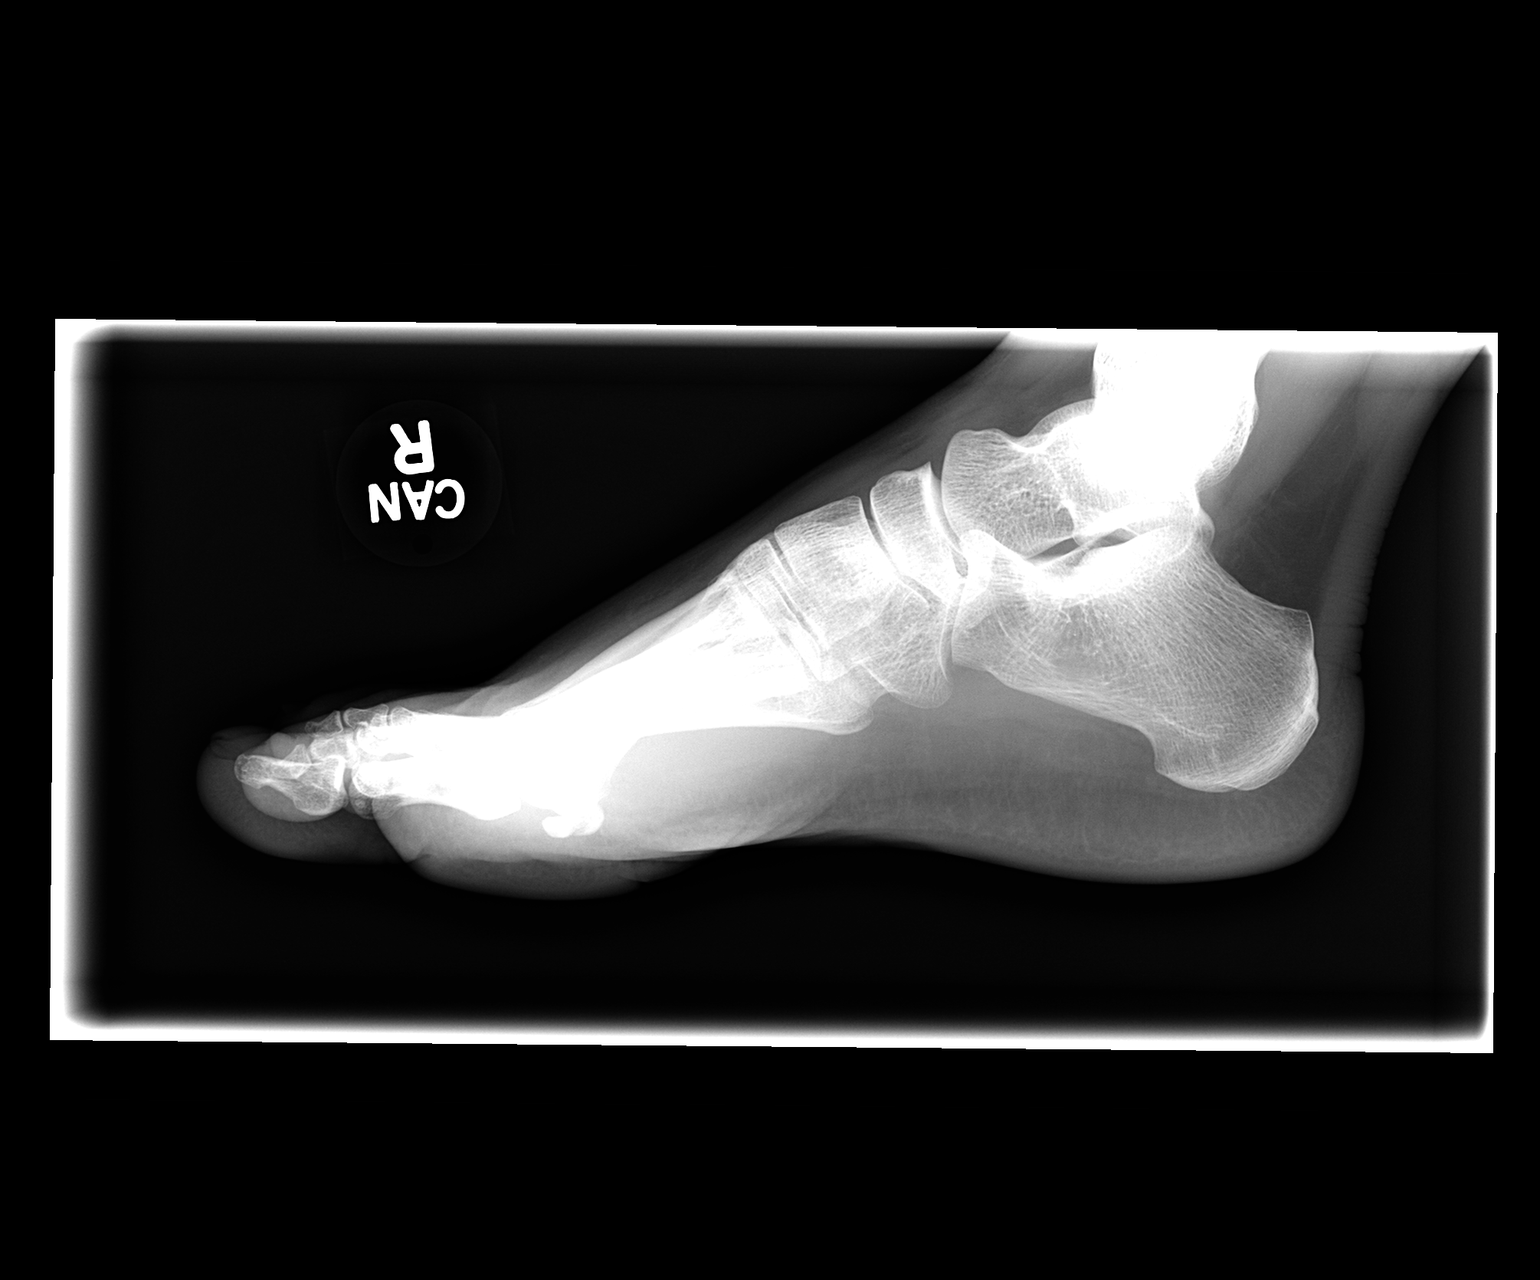

[3 of 3 positions shown; findings below may reference images not displayed]

FINDINGS: There is no evidence of fracture or dislocation. There is no
evidence of arthropathy or other focal bone abnormality. Soft
tissues are unremarkable.
IMPRESSION: Negative.

## 2015-07-29 ENCOUNTER — Ambulatory Visit (INDEPENDENT_AMBULATORY_CARE_PROVIDER_SITE_OTHER): Payer: No Typology Code available for payment source | Admitting: Family Medicine

## 2015-07-29 VITALS — BP 118/80 | HR 82 | Temp 98.3°F | Resp 16 | Ht 61.5 in | Wt 157.8 lb

## 2015-07-29 DIAGNOSIS — Z23 Encounter for immunization: Secondary | ICD-10-CM

## 2015-07-29 DIAGNOSIS — Z0000 Encounter for general adult medical examination without abnormal findings: Secondary | ICD-10-CM

## 2015-07-29 DIAGNOSIS — Z111 Encounter for screening for respiratory tuberculosis: Secondary | ICD-10-CM | POA: Diagnosis not present

## 2015-07-29 NOTE — Patient Instructions (Signed)
It was good to see you today- let us know if you need anything further Please come back to get your PPD read

## 2015-07-29 NOTE — Progress Notes (Signed)
[facility] [address], [place] [address] [phone]  Date:  07/29/2015   Name:  Christie Camacho   DOB:  1981-03-01   MRN:  [id number]  PCP:  Merrilee Seashore, MD    Chief Complaint: Annual Exam and Immunizations   History of Present Illness:  Christie Camacho is a 34 y.o. 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 Indication for care in labor or delivery 11/27/2014  . Gestational hypertension, antepartum 11/27/2014  . Amenorrhea, secondary 03/24/2014  . Pregnant 03/24/2014  . CIN I (cervical intraepithelial neoplasia I)   . Eczema   . Migraines   . Endometriosis     Past Medical History  Diagnosis Date  . CIN I (cervical intraepithelial neoplasia I)   . Migraines   . Eczema   . Endometriosis   . Vaginal Pap smear, abnormal   . H/O laparoscopy 09/28/08    pelvic lap  . Hx of colposcopy with cervical biopsy   . Hx of wisdom tooth extraction   . History of PID     Past Surgical History  Procedure Laterality Date  . Pelvic laparoscopy  09/28/08    DIAGNOSTIC LAPAROSCOPY  . Colposcopy    . Wisdom tooth extraction      Social History  Substance Use Topics  . Smoking status: Never Smoker   . Smokeless tobacco: None  . Alcohol Use: Yes     Comment: Rare; but not while pregnant    Family History  Problem Relation Age of Onset  . Hypertension Mother   . Diabetes Father   . Hypertension Father   . Cancer Maternal Grandmother     STOMACH TUMOR    Allergies  Allergen Reactions  . Latex Rash    Medication list has been reviewed and updated.  Current  Outpatient Prescriptions on File Prior to Visit  Medication Sig Dispense Refill  . Ca Carbonate-Mag Hydroxide (ROLAIDS PO) Take 1 tablet by mouth daily as needed (heartburn).    . calcium carbonate (TUMS - DOSED IN MG ELEMENTAL CALCIUM) 500 MG chewable tablet Chew 1 tablet by mouth daily as needed for indigestion or heartburn.    . Prenatal Vit-Fe Fumarate-FA (PRENATAL MULTIVITAMIN) TABS tablet Take 1 tablet by mouth daily at 12 noon.     No current facility-administered medications on file prior to visit.    Review of Systems:  As per HPI- otherwise negative.   Physical Examination: Filed Vitals:   07/29/15 1309  BP: 118/80  Pulse: 82  Temp: 98.3 F (36.8 C)  Resp: 16   Filed Vitals:   07/29/15 1309  Height: 5' 1.5" (1.562 m)  Weight: 157 lb 12.8 oz (71.578 kg)   Body mass index is 29.34 kg/(m^2). Ideal Body Weight: Weight in (lb) to have BMI = 25: 134.2  GEN: WDWN, NAD, Non-toxic, A & O x 3 HEENT: Atraumatic, Normocephalic. Neck supple. No masses, No LAD. Ears and Nose: No external deformity. CV: RRR, No M/G/R. No JVD. No thrill. No extra heart sounds. PULM: CTA B, no  wheezes, crackles, rhonchi. No retractions. No resp. distress. No accessory muscle use. EXTR: No c/c/e NEURO Normal gait.  PSYCH: Normally interactive. Conversant. Not depressed or anxious appearing.  Calm demeanor.    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

## 2015-08-01 ENCOUNTER — Ambulatory Visit (INDEPENDENT_AMBULATORY_CARE_PROVIDER_SITE_OTHER): Payer: No Typology Code available for payment source

## 2015-08-01 DIAGNOSIS — Z111 Encounter for screening for respiratory tuberculosis: Secondary | ICD-10-CM

## 2015-08-01 LAB — TB SKIN TEST
Induration: 0 mm
TB Skin Test: NEGATIVE

## 2015-08-01 NOTE — Progress Notes (Signed)
Patient here today for PPD read.  PPD placed on 07/29/2015 at 1:45 pm.  PPD read on 08/01/2015 at 12:35 pm.  PPD negative with 0 induration.

## 2015-12-22 ENCOUNTER — Other Ambulatory Visit: Payer: Self-pay | Admitting: Obstetrics

## 2015-12-23 LAB — CYTOLOGY - PAP

## 2017-01-12 ENCOUNTER — Other Ambulatory Visit: Payer: Self-pay | Admitting: Obstetrics

## 2017-01-24 ENCOUNTER — Encounter: Payer: Self-pay | Admitting: Gynecology

## 2018-05-13 ENCOUNTER — Ambulatory Visit (HOSPITAL_COMMUNITY)
Admission: EM | Admit: 2018-05-13 | Discharge: 2018-05-13 | Disposition: A | Discharge status: Home / Self Care | Payer: BLUE CROSS/BLUE SHIELD | Attending: Family Medicine | Admitting: Family Medicine

## 2018-05-13 DIAGNOSIS — R030 Elevated blood-pressure reading, without diagnosis of hypertension: Secondary | ICD-10-CM

## 2018-05-13 DIAGNOSIS — N644 Mastodynia: Secondary | ICD-10-CM | POA: Diagnosis not present

## 2018-05-13 MED ORDER — IBUPROFEN 400 MG PO TABS: 400.0000 mg | ORAL_TABLET | Freq: Four times a day (QID) | ORAL | 0 refills | Status: AC | PRN

## 2018-05-13 NOTE — ED Provider Notes (Signed)
[place]    CSN: [id number] Arrival date & time: 05/13/18  1245     History   Chief Complaint No chief complaint on file.   HPI Christie Camacho is a 37 y.o. female.   The history is provided by the patient. No language interpreter was used.  BREAST PAIN: Left breast pain on and off for 2 weeks, gradually worsening and becoming constant. Pain is throbbing in nature about 6/10 in severity. No recent injury, she had not used anything for pain. No swelling or nipple discharge. Denies family hx of breast CA. Denies previous episode. Elevated BP: Worried  Past Medical History:  Diagnosis Date  . CIN I (cervical intraepithelial neoplasia I)   . Eczema   . Endometriosis   . H/O laparoscopy 09/28/08   pelvic lap  . History of PID   . Hx of colposcopy with cervical biopsy   . Hx of wisdom tooth extraction   . Migraines   . Vaginal Pap smear, abnormal     Patient Active Problem List   Diagnosis Date Noted  . Indication for care in labor or delivery 11/27/2014  . Gestational hypertension, antepartum 11/27/2014  . Amenorrhea, secondary 03/24/2014  . Pregnant 03/24/2014  . CIN I (cervical intraepithelial neoplasia I)   . Eczema   . Migraines   . Endometriosis     Past Surgical History:  Procedure Laterality Date  . COLPOSCOPY    . PELVIC LAPAROSCOPY  09/28/08   DIAGNOSTIC LAPAROSCOPY  . WISDOM TOOTH EXTRACTION      OB History    Gravida  1   Para  1   Term  1   Preterm      AB      Living  1     SAB      TAB      Ectopic      Multiple  0   Live Births  1            Home Medications    Prior to Admission medications   Medication Sig Start Date End Date Taking? Authorizing Provider  Ca Carbonate-Mag Hydroxide (ROLAIDS PO) Take 1 tablet by mouth daily as needed (heartburn).    [provider]  calcium carbonate (TUMS - DOSED IN MG ELEMENTAL CALCIUM) 500 MG chewable tablet Chew 1 tablet by mouth daily as needed for  indigestion or heartburn.    [provider]  norethindrone (CAMILA) 0.35 MG tablet Take 1 tablet by mouth daily.    [provider]  Prenatal Vit-Fe Fumarate-FA (PRENATAL MULTIVITAMIN) TABS tablet Take 1 tablet by mouth daily at 12 noon.    [provider]    Family History Family History  Problem Relation Age of Onset  . Hypertension Mother   . Diabetes Father   . Hypertension Father   . Cancer Maternal Grandmother        STOMACH TUMOR    Social History Social History   Tobacco Use  . Smoking status: Never Smoker  Substance Use Topics  . Alcohol use: Yes    Comment: Rare; but not while pregnant  . Drug use: No     Allergies   Latex   Review of Systems Review of Systems  Respiratory: Negative.   Cardiovascular: Negative.   Gastrointestinal: Negative.   Genitourinary: Negative.   Skin:       Breast pain  All other systems reviewed and are negative.    Physical Exam Triage Vital Signs  Vitals:   05/13/18 1345  BP: (!) 135/96  Pulse: 79  Resp: 16  Temp: 97.9 F (36.6 C)  TempSrc: Oral  SpO2: 100%  Repeat BP by me: 138/95 LMP: 04/22/18  No data found.  Updated Vital Signs There were no vitals taken for this visit.  Visual Acuity Right Eye Distance:   Left Eye Distance:   Bilateral Distance:    Right Eye Near:   Left Eye Near:    Bilateral Near:     Physical Exam  Constitutional: She appears well-nourished. No distress.  Cardiovascular: Normal rate, regular rhythm and normal heart sounds. Exam reveals no friction rub.  No murmur heard. Pulmonary/Chest: Effort normal and breath sounds normal. No respiratory distress. She has no wheezes. She exhibits no mass and no tenderness. Right breast exhibits no inverted nipple, no mass, no nipple discharge, no skin change and no tenderness. Left breast exhibits no inverted nipple, no mass, no nipple discharge, no skin change and no tenderness. No breast swelling, tenderness,  discharge or bleeding. Breasts are symmetrical.  Nursing note and vitals reviewed.    UC Treatments / Results  Labs (all labs ordered are listed, but only abnormal results are displayed) Labs Reviewed - No data to display  EKG None  Radiology No results found.  Procedures Procedures (including critical care time)  Medications Ordered in UC Medications - No data to display  Initial Impression / Assessment and Plan / UC Course  I have reviewed the triage vital signs and the nursing notes.  Pertinent labs & imaging results that were available during my care of the patient were reviewed by me and considered in my medical decision making (see chart for details).  Clinical Course as of May 13 1406  Mon May 13, 2018  1405 BP elevated. Recheck same. Likely white coat hypertension. PCP f/u recommended in about 1-3 days. F/U sooner if symptomatic or persistently elevated BP. She agreed with the plan.   [KE]  1405 Breast exam benign. 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 Use NSAID as needed for pain. F/U with PCP if no improvement in 1 week or if worsening for diagnostic mammogram or breast US. She agreed with the plan.   [KE]    Clinical Course User Index [KE] Kinnie Feil, MD    Mastalgia  Elevated blood pressure reading   Final Clinical Impressions(s) / UC Diagnoses   Final diagnoses:  None   Discharge Instructions   None    ED Prescriptions    None     Controlled Substance Prescriptions [place] Controlled Substance Registry consulted? Not Applicable   Kinnie Feil, MD 05/13/18 1410

## 2018-05-13 NOTE — Discharge Instructions (Signed)
It was nice seeing you today. Breast exam benign. Use Ibuprofen as needed for pain. Since your period is around the corner, this could be the cause of your pain. If no improvement, please your PCP soon for breast imaging.

## 2018-05-13 NOTE — ED Triage Notes (Signed)
Seen  by provider only

## 2018-08-03 ENCOUNTER — Ambulatory Visit (HOSPITAL_COMMUNITY)
Admission: EM | Admit: 2018-08-03 | Discharge: 2018-08-03 | Disposition: A | Discharge status: Home / Self Care | Payer: BLUE CROSS/BLUE SHIELD | Attending: Family Medicine | Admitting: Family Medicine

## 2018-08-03 ENCOUNTER — Encounter (HOSPITAL_COMMUNITY): Payer: Self-pay

## 2018-08-03 DIAGNOSIS — B309 Viral conjunctivitis, unspecified: Secondary | ICD-10-CM | POA: Diagnosis not present

## 2018-08-03 MED ORDER — OLOPATADINE HCL 0.2 % OP SOLN: 1.0000 [drp] | Freq: Every day | OPHTHALMIC | 0 refills | Status: AC

## 2018-08-03 NOTE — ED Triage Notes (Signed)
Pt presents with irritation, swelling, drainage and crustiness of both eyes for the past few days.

## 2018-08-23 NOTE — ED Provider Notes (Signed)
[place]   [id number] 08/03/18 Arrival Time: 1117  ASSESSMENT & PLAN:  1. Acute viral conjunctivitis of both eyes    Meds ordered this encounter  Medications  . Olopatadine HCl 0.2 % SOLN    Sig: Apply 1 drop to eye daily.    Dispense:  2.5 mL    Refill:  0   Discussed the diagnosis and proper care of conjunctivitis.  Stressed household Nurse, mental health. School/daycare note written. Ophthalmic drops per orders. Warm compress to eye(s). Local eye care discussed.  Reviewed expectations re: course of current medical issues. Questions answered. Outlined signs and symptoms indicating need for more acute intervention. Patient verbalized understanding. After Visit Summary given.   SUBJECTIVE:  Christie Camacho is a 37 y.o. female who presents with complaint of persistent "itchiness and irritation" of both eyes.  Onset abrupt, approximately a few days ago. Watery drainage with "crusting" of eyelids in the morning. No specific eye pain reported. Injury: no. Visual changes: no. Contact lens use: no. Self treatment: none reproted. Recovering from recent URI. Afebrile.  ROS: As per HPI.  OBJECTIVE:  Vitals:   08/03/18 1215  BP: (!) 146/100  Pulse: 75  Resp: 20  Temp: 98.7 F (37.1 C)  TempSrc: Oral  SpO2: 99%    General appear Eyes: conjunctivae with 1+ injection and watery drainage; PERRLA; EOMI; no ciliary flush Neck: supple without LAD Lungs: clear to auscultation bilaterally Heart: regular rate and rhythm Skin: warm and dry Psychological: alert and cooperative; normal mood and affect  Allergies  Allergen Reactions  . Latex Rash    Past Medical History:  Diagnosis Date  . CIN I (cervical intraepithelial neoplasia I)   . Eczema   . Endometriosis   . H/O laparoscopy 09/28/08   pelvic lap  . History of PID   . Hx of colposcopy with cervical biopsy   . Hx of wisdom tooth extraction   . Migraines   . Vaginal Pap smear, abnormal    Social History    Socioeconomic History  . Marital status: Single    Spouse name: Not on file  . Number of children: Not on file  . Years of education: Not on file  . Highest education level: Not on file  Occupational History  . Not on file  Social Needs  . Financial resource strain: Not on file  . Food insecurity:    Worry: Not on file    Inability: Not on file  . Transportation needs:    Medical: Not on file    Non-medical: Not on file  Tobacco Use  . Smoking status: Never Smoker  Substance and Sexual Activity  . Alcohol use: Yes    Comment: Rare; but not while pregnant  . Drug use: No  . Sexual activity: Yes    Birth control/protection: Condom    Comment: last intercourse Nov 24 2014  Lifestyle  . Physical activity:    Days per week: Not on file    Minutes per session: Not on file  . Stress: Not on file  Relationships  . Social connections:    Talks on phone: Not on file    Gets together: Not on file    Attends religious service: Not on file    Active member of club or organization: Not on file    Attends meetings of clubs or organizations: Not on file    Relationship status: Not on file  . Intimate partner violence:    Fear of current or ex  partner: Not on file    Emotionally abused: Not on file    Physically abused: Not on file    Forced sexual activity: Not on file  Other Topics Concern  . Not on file  Social History Narrative  . Not on file   Family History  Problem Relation Age of Onset  . Hypertension Mother   . Diabetes Father   . Hypertension Father   . Cancer Maternal Grandmother        STOMACH TUMOR   Past Surgical History:  Procedure Laterality Date  . COLPOSCOPY    . PELVIC LAPAROSCOPY  09/28/08   DIAGNOSTIC LAPAROSCOPY  . WISDOM TOOTH EXTRACTION       Vanessa Kick, MD 08/27/18 0930

## 2021-05-06 ENCOUNTER — Other Ambulatory Visit: Payer: Self-pay

## 2021-05-06 ENCOUNTER — Ambulatory Visit
Admission: EM | Admit: 2021-05-06 | Discharge: 2021-05-06 | Disposition: A | Discharge status: Home / Self Care | Payer: No Typology Code available for payment source | Attending: Internal Medicine | Admitting: Internal Medicine

## 2021-05-06 ENCOUNTER — Encounter: Payer: Self-pay | Admitting: Emergency Medicine

## 2021-05-06 DIAGNOSIS — L2389 Allergic contact dermatitis due to other agents: Secondary | ICD-10-CM

## 2021-05-06 DIAGNOSIS — L509 Urticaria, unspecified: Secondary | ICD-10-CM

## 2021-05-06 MED ORDER — TRIAMCINOLONE ACETONIDE 0.1 % EX CREA: 1.0000 "application " | TOPICAL_CREAM | Freq: Two times a day (BID) | CUTANEOUS | 0 refills | Status: AC

## 2021-05-06 MED ORDER — HYDROXYZINE HCL 25 MG PO TABS
25.0000 mg | ORAL_TABLET | Freq: Four times a day (QID) | ORAL | 0 refills | Status: AC | PRN
Start: 2021-05-06 — End: ?

## 2021-05-06 NOTE — ED Provider Notes (Signed)
[facility]    CSN: [id number] Arrival date & time: 05/06/21  1703      History   Chief Complaint Chief Complaint  Patient presents with   Rash    HPI Christie Camacho is a 40 y.o. female.   Patient presents with 1 week history of rash to bilateral arms and urticaria.  Patient has used Benadryl cream without relief of symptoms.  States that rash started on left knee but has resolved.  Denies any recent changes to lotions, soaps, detergents, foods, etc.  No other family members have symptoms. Denies any difficulty breathing.    Rash  Past Medical History:  Diagnosis Date   CIN I (cervical intraepithelial neoplasia I)    Eczema    Endometriosis    H/O laparoscopy 09/28/08   pelvic lap   History of PID    Hx of colposcopy with cervical biopsy    Hx of wisdom tooth extraction    Migraines    Vaginal Pap smear, abnormal     Patient Active Problem List   Diagnosis Date Noted   Indication for care in labor or delivery 11/27/2014   Gestational hypertension, antepartum 11/27/2014   Amenorrhea, secondary 03/24/2014   Pregnant 03/24/2014   CIN I (cervical intraepithelial neoplasia I)    Eczema    Migraines    Endometriosis     Past Surgical History:  Procedure Laterality Date   COLPOSCOPY     PELVIC LAPAROSCOPY  09/28/08   DIAGNOSTIC LAPAROSCOPY   WISDOM TOOTH EXTRACTION      OB History     Gravida  1   Para  1   Term  1   Preterm      AB      Living  1      SAB      IAB      Ectopic      Multiple  0   Live Births  1            Home Medications    Prior to Admission medications   Medication Sig Start Date End Date Taking? Authorizing Provider  hydrOXYzine (ATARAX/VISTARIL) 25 MG tablet Take 1 tablet (25 mg total) by mouth every 6 (six) hours as needed for itching. 05/06/21  Yes Odis Luster, FNP  triamcinolone cream (KENALOG) 0.1 % Apply 1 application topically 2 (two) times daily. 05/06/21  Yes Odis Luster, FNP  Ca  Carbonate-Mag Hydroxide (ROLAIDS PO) Take 1 tablet by mouth daily as needed (heartburn).    [provider]  calcium carbonate (TUMS - DOSED IN MG ELEMENTAL CALCIUM) 500 MG chewable tablet Chew 1 tablet by mouth daily as needed for indigestion or heartburn.    [provider]  ibuprofen (ADVIL,MOTRIN) 400 MG tablet Take 1 tablet (400 mg total) by mouth every 6 (six) hours as needed. 05/13/18   Kinnie Feil, MD  norethindrone (MICRONOR) 0.35 MG tablet Take 1 tablet by mouth daily.    [provider]  Olopatadine HCl 0.2 % SOLN Apply 1 drop to eye daily. 08/03/18   Vanessa Kick, MD  Prenatal Vit-Fe Fumarate-FA (PRENATAL MULTIVITAMIN) TABS tablet Take 1 tablet by mouth daily at 12 noon.    [provider]    Family History Family History  Problem Relation Age of Onset   Hypertension Mother    Diabetes Father    Hypertension Father    Cancer Maternal Grandmother        STOMACH TUMOR  Social History Social History   Tobacco Use   Smoking status: Never   Smokeless tobacco: Never  Substance Use Topics   Alcohol use: Yes    Comment: Rare; but not while pregnant   Drug use: No     Allergies   Latex   Review of Systems Review of Systems  Skin:  Positive for rash.  Per HPI  Physical Exam Triage Vital Signs ED Triage Vitals  Enc Vitals Group     BP 05/06/21 1729 (!) 143/99     Pulse Rate 05/06/21 1729 81     Resp 05/06/21 1729 18     Temp 05/06/21 1729 98.4 F (36.9 C)     Temp Source 05/06/21 1729 Oral     SpO2 05/06/21 1729 98 %     Weight 05/06/21 1731 165 lb (74.8 kg)     Height 05/06/21 1731 '5\' 1"'$  (1.549 m)     Head Circumference --      Peak Flow --      Pain Score 05/06/21 1731 0     Pain Loc --      Pain Edu? --      Excl. in [place]? --    No data found.  Updated Vital Signs BP (!) 143/99 (BP Location: Left Arm)   Pulse 81   Temp 98.4 F (36.9 C) (Oral)   Resp 18   Ht '5\' 1"'$  (1.549 m)   Wt 165 lb (74.8 kg)   LMP  04/13/2021   SpO2 98%   BMI 31.18 kg/m   Visual Acuity Right Eye Distance:   Left Eye Distance:   Bilateral Distance:    Right Eye Near:   Left Eye Near:    Bilateral Near:     Physical Exam Constitutional:      Appearance: Normal appearance.  HENT:     Head: Normocephalic and atraumatic.  Eyes:     Extraocular Movements: Extraocular movements intact.     Conjunctiva/sclera: Conjunctivae normal.  Pulmonary:     Effort: Pulmonary effort is normal.  Skin:    General: Skin is warm and dry.     Findings: Rash present. Rash is urticarial.     Comments: Patient has diffuse, single, raised, erythematous circular lesions present to bilateral arms. No drainage from lesions.   Neurological:     General: No focal deficit present.     Mental Status: She is alert and oriented to person, place, and time. Mental status is at baseline.  Psychiatric:        Mood and Affect: Mood normal.        Behavior: Behavior normal.        Thought Content: Thought content normal.        Judgment: Judgment normal.     UC Treatments / Results  Labs (all labs ordered are listed, but only abnormal results are displayed) Labs Reviewed - No data to display  EKG   Radiology No results found.  Procedures Procedures (including critical care time)  Medications Ordered in UC Medications - No data to display  Initial Impression / Assessment and Plan / UC Course  I have reviewed the triage vital signs and the nursing notes.  Pertinent labs & imaging results that were available during my care of the patient were reviewed by me and considered in my medical decision making (see chart for details).     Rash is most consistent with possible insect bites. Other differential diagnosis includes contact dermatitis. Will treat with  triamcinolone cream and hydroxyzine antihistamine. Patient to follow up if symptoms do not resolve. Discussed strict return precautions. Patient verbalized understanding and is  agreeable with plan.  Final Clinical Impressions(s) / UC Diagnoses   Final diagnoses:  Allergic contact dermatitis due to other agents  Urticaria     Discharge Instructions      You have been prescribed triamcinolone cream to apply to affected areas and hydroxyzine antihistamine to take as needed for itching. Please be advised that hydroxyzine can cause drowsiness.      ED Prescriptions     Medication Sig Dispense Auth. Provider   hydrOXYzine (ATARAX/VISTARIL) 25 MG tablet Take 1 tablet (25 mg total) by mouth every 6 (six) hours as needed for itching. 12 tablet Odis Luster, FNP   triamcinolone cream (KENALOG) 0.1 % Apply 1 application topically 2 (two) times daily. 30 g Odis Luster, FNP      PDMP not reviewed this encounter.   Odis Luster, [place] 05/06/21 [phone]

## 2021-05-06 NOTE — Discharge Instructions (Addendum)
You have been prescribed triamcinolone cream to apply to affected areas and hydroxyzine antihistamine to take as needed for itching. Please be advised that hydroxyzine can cause drowsiness.

## 2021-05-06 NOTE — ED Triage Notes (Signed)
Patient c/o rash on both arms x 1 week, itching, redness, applied Benadryl cream.  No new lotions, soaps or detergents.

## 2022-07-17 ENCOUNTER — Ambulatory Visit
Admission: EM | Admit: 2022-07-17 | Discharge: 2022-07-17 | Disposition: A | Discharge status: Home / Self Care | Payer: No Typology Code available for payment source | Attending: Physician Assistant | Admitting: Physician Assistant

## 2022-07-17 DIAGNOSIS — Z1152 Encounter for screening for COVID-19: Secondary | ICD-10-CM | POA: Diagnosis present

## 2022-07-17 DIAGNOSIS — J069 Acute upper respiratory infection, unspecified: Secondary | ICD-10-CM | POA: Insufficient documentation

## 2022-07-17 LAB — RESP PANEL BY RT-PCR (FLU A&B, COVID) ARPGX2
Influenza A by PCR: NEGATIVE
Influenza B by PCR: NEGATIVE
SARS Coronavirus 2 by RT PCR: NEGATIVE

## 2022-07-17 MED ORDER — PROMETHAZINE-DM 6.25-15 MG/5ML PO SYRP: 5.0000 mL | ORAL_SOLUTION | Freq: Four times a day (QID) | ORAL | 0 refills | Status: AC | PRN

## 2022-07-17 NOTE — ED Provider Notes (Signed)
[facility]    CSN: [id number] Arrival date & time: 07/17/22  0809      History   Chief Complaint Chief Complaint  Patient presents with   Sore Throat   Cough    HPI Christie Camacho is a 41 y.o. female.   Patient here for evaluation of sore throat, nonproductive cough she had 5 days.  She has not any fever.  She denies any ear pain.  She has tried TheraFlu without significant relief.  Blood pressure is elevated in office but patient reports she has not had any issues with blood pressure in the past.  Daughter is here with similar symptoms.  The history is provided by the patient.  Sore Throat Pertinent negatives include no abdominal pain and no shortness of breath.  Cough Associated symptoms: sore throat   Associated symptoms: no chills, no ear pain, no eye discharge, no fever, no shortness of breath and no wheezing     Past Medical History:  Diagnosis Date   CIN I (cervical intraepithelial neoplasia I)    Eczema    Endometriosis    H/O laparoscopy 09/28/08   pelvic lap   History of PID    Hx of colposcopy with cervical biopsy    Hx of wisdom tooth extraction    Migraines    Vaginal Pap smear, abnormal     Patient Active Problem List   Diagnosis Date Noted   Indication for care in labor or delivery 11/27/2014   Gestational hypertension, antepartum 11/27/2014   Amenorrhea, secondary 03/24/2014   Pregnant 03/24/2014   CIN I (cervical intraepithelial neoplasia I)    Eczema    Migraines    Endometriosis     Past Surgical History:  Procedure Laterality Date   COLPOSCOPY     PELVIC LAPAROSCOPY  09/28/08   DIAGNOSTIC LAPAROSCOPY   WISDOM TOOTH EXTRACTION      OB History     Gravida  1   Para  1   Term  1   Preterm      AB      Living  1      SAB      IAB      Ectopic      Multiple  0   Live Births  1            Home Medications    Prior to Admission medications   Medication Sig Start Date End Date Taking?  Authorizing Provider  promethazine-dextromethorphan (PROMETHAZINE-DM) 6.25-15 MG/5ML syrup Take 5 mLs by mouth 4 (four) times daily as needed for cough. 07/17/22  Yes Francene Finders, PA-C  Ca Carbonate-Mag Hydroxide (ROLAIDS PO) Take 1 tablet by mouth daily as needed (heartburn).    [provider]  calcium carbonate (TUMS - DOSED IN MG ELEMENTAL CALCIUM) 500 MG chewable tablet Chew 1 tablet by mouth daily as needed for indigestion or heartburn.    [provider]  hydrOXYzine (ATARAX/VISTARIL) 25 MG tablet Take 1 tablet (25 mg total) by mouth every 6 (six) hours as needed for itching. 05/06/21   Teodora Medici, FNP  ibuprofen (ADVIL,MOTRIN) 400 MG tablet Take 1 tablet (400 mg total) by mouth every 6 (six) hours as needed. 05/13/18   Kinnie Feil, MD  norethindrone (MICRONOR) 0.35 MG tablet Take 1 tablet by mouth daily.    [provider]  Olopatadine HCl 0.2 % SOLN Apply 1 drop to eye daily. 08/03/18   Vanessa Kick, MD  Prenatal Vit-Fe  Fumarate-FA (PRENATAL MULTIVITAMIN) TABS tablet Take 1 tablet by mouth daily at 12 noon.    [provider]  triamcinolone cream (KENALOG) 0.1 % Apply 1 application topically 2 (two) times daily. 05/06/21   Teodora Medici, FNP    Family History Family History  Problem Relation Age of Onset   Hypertension Mother    Diabetes Father    Hypertension Father    Cancer Maternal Grandmother        STOMACH TUMOR    Social History Social History   Tobacco Use   Smoking status: Never   Smokeless tobacco: Never  Substance Use Topics   Alcohol use: Yes    Comment: Rare; but not while pregnant   Drug use: No     Allergies   Latex   Review of Systems Review of Systems  Constitutional:  Negative for chills and fever.  HENT:  Positive for congestion and sore throat. Negative for ear pain.   Eyes:  Negative for discharge and redness.  Respiratory:  Positive for cough. Negative for shortness of breath and wheezing.    Gastrointestinal:  Negative for abdominal pain, diarrhea, nausea and vomiting.     Physical Exam Triage Vital Signs ED Triage Vitals  Enc Vitals Group     BP 07/17/22 0939 (!) 170/118     Pulse Rate 07/17/22 0939 82     Resp 07/17/22 0939 17     Temp 07/17/22 0939 98.3 F (36.8 C)     Temp Source 07/17/22 0939 Oral     SpO2 07/17/22 0939 97 %     Weight --      Height --      Head Circumference --      Peak Flow --      Pain Score 07/17/22 0941 6     Pain Loc --      Pain Edu? --      Excl. in [place]? --    No data found.  Updated Vital Signs BP (!) 178/120 (BP Location: Right Arm)   Pulse 82   Temp 98.3 F (36.8 C) (Oral)   Resp 17   LMP  (LMP Unknown)   SpO2 97%      Physical Exam Vitals and nursing note reviewed.  Constitutional:      General: She is not in acute distress.    Appearance: Normal appearance. She is not ill-appearing.  HENT:     Head: Normocephalic and atraumatic.     Nose: Congestion present.     Mouth/Throat:     Mouth: Mucous membranes are moist.     Pharynx: Posterior oropharyngeal erythema present. No oropharyngeal exudate.  Eyes:     Conjunctiva/sclera: Conjunctivae normal.  Cardiovascular:     Rate and Rhythm: Normal rate and regular rhythm.     Heart sounds: Normal heart sounds. No murmur heard. Pulmonary:     Effort: Pulmonary effort is normal. No respiratory distress.     Breath sounds: Normal breath sounds. No wheezing, rhonchi or rales.  Skin:    General: Skin is warm and dry.  Neurological:     Mental Status: She is alert.  Psychiatric:        Mood and Affect: Mood normal.        Thought Content: Thought content normal.      UC Treatments / Results  Labs (all labs ordered are listed, but only abnormal results are displayed) Labs Reviewed  RESP PANEL BY RT-PCR (FLU A&B, COVID) [id number]  EKG   Radiology No results found.  Procedures Procedures (including critical care time)  Medications Ordered in  UC Medications - No data to display  Initial Impression / Assessment and Plan / UC Course  I have reviewed the triage vital signs and the nursing notes.  Pertinent labs & imaging results that were available during my care of the patient were reviewed by me and considered in my medical decision making (see chart for details).    We will order COVID and flu screening.  Discussed likely viral etiology, and will treat with cough syrup to hopefully help with symptom management.  Encouraged follow-up regarding blood pressure.  Patient expresses understanding.  Final Clinical Impressions(s) / UC Diagnoses   Final diagnoses:  Acute upper respiratory infection  Encounter for screening for COVID-19   Discharge Instructions   None    ED Prescriptions     Medication Sig Dispense Auth. Provider   promethazine-dextromethorphan (PROMETHAZINE-DM) 6.25-15 MG/5ML syrup Take 5 mLs by mouth 4 (four) times daily as needed for cough. 118 mL Francene Finders, PA-C      PDMP not reviewed this encounter.   Francene Finders, PA-C 07/17/22 1053

## 2022-07-17 NOTE — ED Triage Notes (Signed)
Pt presents with sore throat and non productive cough X 5 days.

## 2023-07-05 ENCOUNTER — Other Ambulatory Visit: Payer: Self-pay | Admitting: Obstetrics

## 2023-07-05 DIAGNOSIS — R928 Other abnormal and inconclusive findings on diagnostic imaging of breast: Secondary | ICD-10-CM

## 2023-07-14 ENCOUNTER — Ambulatory Visit: Payer: No Typology Code available for payment source

## 2023-07-14 ENCOUNTER — Ambulatory Visit
Admission: RE | Admit: 2023-07-14 | Discharge: 2023-07-14 | Disposition: A | Discharge status: Home / Self Care | Payer: No Typology Code available for payment source | Source: Ambulatory Visit | Attending: Obstetrics | Admitting: Obstetrics

## 2023-07-14 DIAGNOSIS — R928 Other abnormal and inconclusive findings on diagnostic imaging of breast: Secondary | ICD-10-CM
